# Patient Record
Sex: Male | Born: 1975
Health system: Southern US, Community
[De-identification: ages and names within clinical notes are randomized; demographics above are authoritative.]

## PROBLEM LIST (undated history)

## (undated) DIAGNOSIS — I10 Essential (primary) hypertension: Secondary | ICD-10-CM

## (undated) HISTORY — DX: Essential (primary) hypertension: I10

## (undated) HISTORY — PX: NO PAST SURGERIES: SHX2092

---

## 2015-03-09 ENCOUNTER — Encounter: Payer: Self-pay | Admitting: Family Medicine

## 2015-03-09 ENCOUNTER — Ambulatory Visit (INDEPENDENT_AMBULATORY_CARE_PROVIDER_SITE_OTHER): Payer: BLUE CROSS/BLUE SHIELD | Admitting: Family Medicine

## 2015-03-09 VITALS — BP 150/82 | HR 64 | Temp 98.3°F | Resp 16 | Ht 70.0 in | Wt 190.3 lb

## 2015-03-09 DIAGNOSIS — L723 Sebaceous cyst: Secondary | ICD-10-CM | POA: Diagnosis not present

## 2015-03-09 DIAGNOSIS — I10 Essential (primary) hypertension: Secondary | ICD-10-CM | POA: Diagnosis not present

## 2015-03-09 MED ORDER — HYDROCHLOROTHIAZIDE 12.5 MG PO TABS: 12.5000 mg | ORAL_TABLET | Freq: Every day | ORAL | Status: DC

## 2015-03-09 MED ORDER — HYDROCHLOROTHIAZIDE 12.5 MG PO TABS
12.5000 mg | ORAL_TABLET | Freq: Every day | ORAL | Status: DC
Start: 2015-03-09 — End: 2017-05-09

## 2015-03-09 NOTE — Progress Notes (Signed)
Subjective:    Patient ID: Marcus Proctor, male    DOB: [birth year], 39 y.o.   MRN: [id number]  HPI: Marcus AnaReggie Proctor is a 39 y.o. male presenting on 03/09/2015 for Hypertension   Hypertension Pertinent negatives include no chest pain, headaches or shortness of breath.    Pt presents for BP follow-up.  He is currently out of medication. He was taking 12.5mg  of HCTZ. Has been out of medication for sometime. No CP, SOB, or visual changes. No HA. Limits salt in diet. Exercise- jogging and weight lifting.   Growth on back- present several years. Knows it's there. Doesn't change sizes. Not red. Non painful.   Past Medical History  Diagnosis Date  . Hypertension     No current outpatient prescriptions on file prior to visit.   No current facility-administered medications on file prior to visit.    Review of Systems  Constitutional: Negative for fever and chills.  Respiratory: Negative for chest tightness, shortness of breath and wheezing.   Cardiovascular: Negative for chest pain.  Gastrointestinal: Negative.   Endocrine: Negative for cold intolerance, heat intolerance, polydipsia, polyphagia and polyuria.  Neurological: Negative for light-headedness, numbness and headaches.  Psychiatric/Behavioral: Negative.    Per HPI unless specifically indicated above     Objective:    BP 150/82 mmHg  Pulse 64  Temp(Src) 98.3 F (36.8 C) (Oral)  Resp 16  Ht 5\' 10"  (1.778 m)  Wt 190 lb 4.8 oz (86.32 kg)  BMI 27.31 kg/m2  Wt Readings from Last 3 Encounters:  03/09/15 190 lb 4.8 oz (86.32 kg)    Physical Exam  Constitutional: He is oriented to person, place, and time. He appears well-developed and well-nourished. No distress.  Neck: Normal range of motion. Neck supple. No thyromegaly present.  Cardiovascular: Normal rate and regular rhythm.  Exam reveals no gallop and no friction rub.   No murmur heard. Pulmonary/Chest: Effort normal and breath sounds normal.  Abdominal: Soft. Bowel  sounds are normal. There is no tenderness. There is no rebound.  Musculoskeletal: Normal range of motion. He exhibits no edema or tenderness.  Lymphadenopathy:    He has no cervical adenopathy.  Neurological: He is alert and oriented to person, place, and time.  Skin: Skin is warm and dry. He is not diaphoretic.   No results found for this or any previous visit.    Assessment & Plan:   Problem List Items Addressed This Visit      Cardiovascular and Mediastinum   Essential hypertension - Primary    Renew HCTZ.  Pt encouraged to check BP at home. Reviewed DASH diet. Encouraged continued exercise.  Check BMP. Return to clinic 1 mos for BP check.      Relevant Medications   hydrochlorothiazide (HYDRODIURIL) 12.5 MG tablet   Other Relevant Orders   Basic Metabolic Panel (BMET)    Other Visit Diagnoses    Sebaceous cyst        Cyst on back, not inflamed. Pt would like to monitor.        Meds ordered this encounter  Medications  . DISCONTD: hydrochlorothiazide (HYDRODIURIL) 12.5 MG tablet    Sig: Take 1 tablet (12.5 mg total) by mouth daily.    Dispense:  90 tablet    Refill:  3    Order Specific Question:  Supervising Provider    Answer:  Janeann ForehandHAWKINS JR, JAMES H [phone][[id number]]  . hydrochlorothiazide (HYDRODIURIL) 12.5 MG tablet    Sig: Take 1 tablet (12.5 mg total) by mouth  daily.    Dispense:  90 tablet    Refill:  3    Order Specific Question:  Supervising Provider    Answer:  Janeann Forehand [[id number]]      Follow up plan: Return in about 4 weeks (around 04/06/2015) for BP check.

## 2015-03-09 NOTE — Assessment & Plan Note (Signed)
Renew HCTZ.  Pt encouraged to check BP at home. Reviewed DASH diet. Encouraged continued exercise.  Check BMP. Return to clinic 1 mos for BP check.

## 2015-03-09 NOTE — Patient Instructions (Signed)
Your goal blood pressure is 140/90. Work on low salt/sodium diet - goal <2.5gm (2,500mg) per day. Eat a diet high in fruits/vegetables and whole grains.  Look into mediterranean and DASH diet. Goal activity is 150min/wk of moderate intensity exercise.  This can be split into 30 minute chunks.  If you are not at this level, you can start with smaller 10-15 min increments and slowly build up activity. Look at www.heart.org for more resources  Please seek immediate medical attention at ER or Urgent Care if you develop: Chest pain, pressure or tightness. Shortness of breath accompanied by nausea or diaphoresis Visual changes Numbness or tingling on one side of the body Facial droop Altered mental status Or any concerning symptoms.  

## 2015-03-10 LAB — BASIC METABOLIC PANEL
BUN/Creatinine Ratio: 13 (ref 9–20)
BUN: 14 mg/dL (ref 6–24)
CALCIUM: 9.4 mg/dL (ref 8.7–10.2)
CHLORIDE: 101 mmol/L (ref 97–106)
CO2: 26 mmol/L (ref 18–29)
Creatinine, Ser: 1.1 mg/dL (ref 0.76–1.27)
GFR calc Af Amer: 97 mL/min/{1.73_m2} (ref >59)
GFR, EST NON AFRICAN AMERICAN: 84 mL/min/{1.73_m2} (ref >59)
GLUCOSE: 87 mg/dL (ref 65–99)
POTASSIUM: 4.1 mmol/L (ref 3.5–5.2)
SODIUM: 142 mmol/L (ref 136–144)

## 2015-04-06 ENCOUNTER — Encounter: Payer: Self-pay | Admitting: Family Medicine

## 2015-04-06 ENCOUNTER — Ambulatory Visit (INDEPENDENT_AMBULATORY_CARE_PROVIDER_SITE_OTHER): Payer: BLUE CROSS/BLUE SHIELD | Admitting: Family Medicine

## 2015-04-06 VITALS — BP 128/88 | HR 71 | Temp 98.2°F | Resp 16 | Ht 70.0 in | Wt 190.6 lb

## 2015-04-06 DIAGNOSIS — Z23 Encounter for immunization: Secondary | ICD-10-CM | POA: Diagnosis not present

## 2015-04-06 DIAGNOSIS — I10 Essential (primary) hypertension: Secondary | ICD-10-CM | POA: Diagnosis not present

## 2015-04-06 NOTE — Patient Instructions (Signed)
Your goal blood pressure is 140/90. Work on low salt/sodium diet - goal <2.5gm (2,500mg) per day. Eat a diet high in fruits/vegetables and whole grains.  Look into mediterranean and DASH diet. Goal activity is 150min/wk of moderate intensity exercise.  This can be split into 30 minute chunks.  If you are not at this level, you can start with smaller 10-15 min increments and slowly build up activity. Look at www.heart.org for more resources  Please seek immediate medical attention at ER or Urgent Care if you develop: Chest pain, pressure or tightness. Shortness of breath accompanied by nausea or diaphoresis Visual changes Numbness or tingling on one side of the body Facial droop Altered mental status Or any concerning symptoms.  

## 2015-04-06 NOTE — Assessment & Plan Note (Signed)
BP controlled with restarting his medication. Doing well on medication at home. DASH diet reviewed.  RTC 6 mos.

## 2015-04-06 NOTE — Progress Notes (Signed)
Subjective:    Patient ID: Marcus Proctor, male    DOB: 07-10-1975, 39 y.o.   MRN: [id number]  HPI: Marcus Proctor is a 39 y.o. male presenting on 04/06/2015 for Hypertension   Hypertension This is a chronic problem. The problem has been gradually improving since onset. The problem is controlled. Pertinent negatives include no anxiety, chest pain, headaches, neck pain, palpitations, peripheral edema, shortness of breath or sweats. There are no associated agents to hypertension. Risk factors for coronary artery disease include male gender. Past treatments include diuretics. The current treatment provides significant improvement. There are no compliance problems.     Past Medical History  Diagnosis Date  . Hypertension     Current Outpatient Prescriptions on File Prior to Visit  Medication Sig  . hydrochlorothiazide (HYDRODIURIL) 12.5 MG tablet Take 1 tablet (12.5 mg total) by mouth daily.   No current facility-administered medications on file prior to visit.    Review of Systems  Constitutional: Negative for fever and chills.  HENT: Negative.   Respiratory: Negative for chest tightness, shortness of breath and wheezing.   Cardiovascular: Negative for chest pain, palpitations and leg swelling.  Gastrointestinal: Negative for nausea, vomiting and abdominal pain.  Endocrine: Negative.   Genitourinary: Negative for dysuria, urgency, discharge, penile pain and testicular pain.  Musculoskeletal: Negative for back pain, joint swelling, arthralgias and neck pain.  Skin: Negative.   Neurological: Negative for dizziness, weakness, numbness and headaches.  Psychiatric/Behavioral: Negative for sleep disturbance and dysphoric mood.   Per HPI unless specifically indicated above     Objective:    BP 128/88 mmHg  Pulse 71  Temp(Src) 98.2 F (36.8 C) (Oral)  Resp 16  Ht  (1.778 m)  Wt 190 lb 9.6 oz (86.456 kg)  BMI 27.35 kg/m2  Wt Readings from Last 3 Encounters:  04/06/15 190 lb  9.6 oz (86.456 kg)  03/09/15 190 lb 4.8 oz (86.32 kg)    Physical Exam  Constitutional: He is oriented to person, place, and time. He appears well-developed and well-nourished. No distress.  HENT:  Head: Normocephalic and atraumatic.  Neck: Neck supple. No thyromegaly present.  Cardiovascular: Normal rate, regular rhythm and normal heart sounds.  Exam reveals no gallop and no friction rub.   No murmur heard. Pulmonary/Chest: Effort normal and breath sounds normal. He has no wheezes.  Abdominal: Soft. Bowel sounds are normal. He exhibits no distension. There is no tenderness. There is no rebound.  Musculoskeletal: Normal range of motion. He exhibits no edema or tenderness.  Neurological: He is alert and oriented to person, place, and time. He has normal reflexes.  Skin: Skin is warm and dry. No rash noted. No erythema.  Psychiatric: He has a normal mood and affect. His behavior is normal. Thought content normal.   Results for orders placed or performed in visit on 03/09/15  Basic Metabolic Panel (BMET)  Result Value Ref Range   Glucose 87 65 - 99 mg/dL   BUN 14 6 - 24 mg/dL   Creatinine, Ser 4.09 0.76 - 1.27 mg/dL   GFR calc non Af Amer 84 >59 mL/min/1.73   GFR calc Af Amer 97 >59 mL/min/1.73   BUN/Creatinine Ratio 13 9 - 20   Sodium 142 136 - 144 mmol/L   Potassium 4.1 3.5 - 5.2 mmol/L   Chloride 101 97 - 106 mmol/L   CO2 26 18 - 29 mmol/L   Calcium 9.4 8.7 - 10.2 mg/dL      Assessment & Plan:  Problem List Items Addressed This Visit      Cardiovascular and Mediastinum   Essential hypertension - Primary    BP controlled with restarting his medication. Doing well on medication at home. DASH diet reviewed.  RTC 6 mos.        Other Visit Diagnoses    Need for influenza vaccination        Relevant Orders    Flu Vaccine QUAD 36+ mos PF IM (Fluarix & Fluzone Quad PF) (Completed)       No orders of the defined types were placed in this encounter.      Follow up  plan: Return in about 6 months (around 10/05/2015).

## 2015-10-11 ENCOUNTER — Ambulatory Visit: Payer: BLUE CROSS/BLUE SHIELD | Admitting: Family Medicine

## 2017-05-07 ENCOUNTER — Encounter: Payer: Self-pay | Admitting: Emergency Medicine

## 2017-05-07 ENCOUNTER — Emergency Department
Admission: EM | Admit: 2017-05-07 | Discharge: 2017-05-08 | Disposition: A | Discharge status: Home / Self Care | Payer: BLUE CROSS/BLUE SHIELD | Attending: Emergency Medicine | Admitting: Emergency Medicine

## 2017-05-07 ENCOUNTER — Other Ambulatory Visit: Payer: Self-pay

## 2017-05-07 DIAGNOSIS — R509 Fever, unspecified: Secondary | ICD-10-CM | POA: Diagnosis not present

## 2017-05-07 DIAGNOSIS — I10 Essential (primary) hypertension: Secondary | ICD-10-CM | POA: Insufficient documentation

## 2017-05-07 DIAGNOSIS — R1032 Left lower quadrant pain: Secondary | ICD-10-CM | POA: Insufficient documentation

## 2017-05-07 DIAGNOSIS — K5289 Other specified noninfective gastroenteritis and colitis: Secondary | ICD-10-CM | POA: Insufficient documentation

## 2017-05-07 DIAGNOSIS — K529 Noninfective gastroenteritis and colitis, unspecified: Secondary | ICD-10-CM

## 2017-05-07 DIAGNOSIS — R109 Unspecified abdominal pain: Secondary | ICD-10-CM | POA: Diagnosis not present

## 2017-05-07 DIAGNOSIS — Z79899 Other long term (current) drug therapy: Secondary | ICD-10-CM | POA: Insufficient documentation

## 2017-05-07 DIAGNOSIS — K56699 Other intestinal obstruction unspecified as to partial versus complete obstruction: Secondary | ICD-10-CM | POA: Diagnosis not present

## 2017-05-07 LAB — COMPREHENSIVE METABOLIC PANEL
ALBUMIN: 3.8 g/dL (ref 3.5–5.0)
ALT: 26 U/L (ref 17–63)
AST: 23 U/L (ref 15–41)
Alkaline Phosphatase: 66 U/L (ref 38–126)
Anion gap: 8 (ref 5–15)
BUN: 15 mg/dL (ref 6–20)
CHLORIDE: 100 mmol/L — AB (ref 101–111)
CO2: 29 mmol/L (ref 22–32)
Calcium: 9.3 mg/dL (ref 8.9–10.3)
Creatinine, Ser: 1.06 mg/dL (ref 0.61–1.24)
GFR calc Af Amer: >60 mL/min (ref >60)
Glucose, Bld: 123 mg/dL — ABNORMAL HIGH (ref 65–99)
POTASSIUM: 3.7 mmol/L (ref 3.5–5.1)
SODIUM: 137 mmol/L (ref 135–145)
Total Bilirubin: 0.9 mg/dL (ref 0.3–1.2)
Total Protein: 8.5 g/dL — ABNORMAL HIGH (ref 6.5–8.1)

## 2017-05-07 LAB — CBC
HEMATOCRIT: 41.9 % (ref 40.0–52.0)
Hemoglobin: 13.8 g/dL (ref 13.0–18.0)
MCH: 27.8 pg (ref 26.0–34.0)
MCHC: 32.9 g/dL (ref 32.0–36.0)
MCV: 84.6 fL (ref 80.0–100.0)
Platelets: 255 10*3/uL (ref 150–440)
RBC: 4.95 MIL/uL (ref 4.40–5.90)
RDW: 12.2 % (ref 11.5–14.5)
WBC: 14.4 10*3/uL — AB (ref 3.8–10.6)

## 2017-05-07 LAB — URINALYSIS, COMPLETE (UACMP) WITH MICROSCOPIC
BACTERIA UA: NONE SEEN
BILIRUBIN URINE: NEGATIVE
Glucose, UA: NEGATIVE mg/dL
Hgb urine dipstick: NEGATIVE
KETONES UR: NEGATIVE mg/dL
LEUKOCYTES UA: NEGATIVE
Nitrite: NEGATIVE
Protein, ur: NEGATIVE mg/dL
SPECIFIC GRAVITY, URINE: 1.021 (ref 1.005–1.030)
SQUAMOUS EPITHELIAL / LPF: NONE SEEN
pH: 5 (ref 5.0–8.0)

## 2017-05-07 LAB — LIPASE, BLOOD: Lipase: 17 U/L (ref 11–51)

## 2017-05-07 NOTE — ED Triage Notes (Addendum)
Pt presents [facility] ED with intermittent left lower abd pain since 1/24. Pt took laxative on Saturday evening and pain improved slightly after producing a small bowel movement. Pain returned Sunday. Pt has no hx of the same. Denies nausea or vomiting. Xray performed at urgent care prior to arrival with "distention of large intestine, 1X9.17cm loop of dilated ascending colon" seen. Affected area tender with palpation.

## 2017-05-07 NOTE — ED Notes (Signed)
Pt to the er for air in his abd per urgent care. Pt last BM 5 days ago. Pt states he has not been able to pass gas. Pt reports tenderness to the luq only. No guarding on palpation.

## 2017-05-08 ENCOUNTER — Emergency Department: Payer: BLUE CROSS/BLUE SHIELD

## 2017-05-08 ENCOUNTER — Encounter: Payer: Self-pay | Admitting: Radiology

## 2017-05-08 DIAGNOSIS — R109 Unspecified abdominal pain: Secondary | ICD-10-CM | POA: Diagnosis not present

## 2017-05-08 MED ORDER — ONDANSETRON 4 MG PO TBDP: 4.0000 mg | ORAL_TABLET | Freq: Three times a day (TID) | ORAL | 0 refills | Status: DC | PRN

## 2017-05-08 MED ORDER — MORPHINE SULFATE (PF) 4 MG/ML IV SOLN
4.0000 mg | Freq: Once | INTRAVENOUS | Status: AC
  Administered 2017-05-08: 4 mg via INTRAVENOUS
  Filled 2017-05-08: qty 1

## 2017-05-08 MED ORDER — ACETAMINOPHEN 325 MG PO TABS
650.0000 mg | ORAL_TABLET | Freq: Once | ORAL | Status: AC
  Administered 2017-05-08: 650 mg via ORAL
  Filled 2017-05-08: qty 2

## 2017-05-08 MED ORDER — IOPAMIDOL (ISOVUE-300) INJECTION 61%
100.0000 mL | Freq: Once | INTRAVENOUS | Status: AC | PRN
  Administered 2017-05-08: 100 mL via INTRAVENOUS

## 2017-05-08 MED ORDER — CIPROFLOXACIN IN D5W 400 MG/200ML IV SOLN
400.0000 mg | Freq: Once | INTRAVENOUS | Status: AC
  Administered 2017-05-08: 400 mg via INTRAVENOUS
  Filled 2017-05-08: qty 200

## 2017-05-08 MED ORDER — METRONIDAZOLE 500 MG PO TABS: 500.0000 mg | ORAL_TABLET | Freq: Two times a day (BID) | ORAL | 0 refills | Status: AC

## 2017-05-08 MED ORDER — SODIUM CHLORIDE 0.9 % IV BOLUS (SEPSIS)
1000.0000 mL | Freq: Once | INTRAVENOUS | Status: AC
  Administered 2017-05-08: 1000 mL via INTRAVENOUS

## 2017-05-08 MED ORDER — TRAMADOL HCL 50 MG PO TABS: 50.0000 mg | ORAL_TABLET | Freq: Four times a day (QID) | ORAL | 0 refills | Status: DC | PRN

## 2017-05-08 MED ORDER — CIPROFLOXACIN HCL 500 MG PO TABS: 500.0000 mg | ORAL_TABLET | Freq: Two times a day (BID) | ORAL | 0 refills | Status: AC

## 2017-05-08 MED ORDER — METRONIDAZOLE IN NACL 5-0.79 MG/ML-% IV SOLN
500.0000 mg | Freq: Once | INTRAVENOUS | Status: AC
  Administered 2017-05-08: 500 mg via INTRAVENOUS
  Filled 2017-05-08: qty 100

## 2017-05-08 MED ORDER — ONDANSETRON HCL 4 MG/2ML IJ SOLN
4.0000 mg | Freq: Once | INTRAMUSCULAR | Status: AC
  Administered 2017-05-08: 4 mg via INTRAVENOUS
  Filled 2017-05-08: qty 2

## 2017-05-08 NOTE — ED Notes (Signed)
Pt back from CT

## 2017-05-08 NOTE — ED Notes (Signed)
Patient discharged to home per MD order. Patient in stable condition, and deemed medically cleared by ED provider for discharge. Discharge instructions reviewed with patient/family using "Teach Back"; verbalized understanding of medication education and administration, and information about follow-up care. Denies further concerns. ° °

## 2017-05-08 NOTE — Discharge Instructions (Signed)
Please follow up with he acute care clinic and then GI once you have completed your antibiotic course.

## 2017-05-08 NOTE — ED Notes (Signed)
Pt takes HCTZ for HTN but is noncompliant. Pt educated on the importance of the med.

## 2017-05-08 NOTE — ED Notes (Signed)
Patient transported to CT 

## 2017-05-08 NOTE — ED Provider Notes (Signed)
[facility] - [facility] Emergency Department Provider Note   ____________________________________________   First MD Initiated Contact with Patient 05/07/17 2335     (approximate)  I have reviewed the triage vital signs and the nursing notes.   HISTORY  Chief Complaint Abdominal Pain and Fever    HPI Marcus Proctor is a 42 y.o. male who comes into the hospital today with some lower abdominal pain and fever.  This started approximately 5 days ago.  The patient did not know he had a fever until today.  He denies any vomiting or diarrhea but has had some constipation.  His last normal bowel movement was 6 days ago.  The patient states that the pain is a 7-8 out of 10 in intensity currently.  It is sharp pain and will go away.  The patient tried taking some Dulcolax on Saturday.  The pain improved but then it came back the next day.  He has not really had much of an urge to go to the bathroom.  He is never had any episodes like this before.  The patient went to urgent care and they said that his bowels were distended so he was sent here for further evaluation.   Past Medical History:  Diagnosis Date  . Hypertension     Patient Active Problem List   Diagnosis Date Noted  . Essential hypertension 03/09/2015    History reviewed. No pertinent surgical history.  Prior to Admission medications   Medication Sig Start Date End Date Taking? Authorizing Provider  ciprofloxacin (CIPRO) 500 MG tablet Take 1 tablet (500 mg total) by mouth 2 (two) times daily for 7 days. 05/08/17 05/15/17  Rebecka ApleyWebster, Rafeal Skibicki P, MD  hydrochlorothiazide (HYDRODIURIL) 12.5 MG tablet Take 1 tablet (12.5 mg total) by mouth daily. 03/09/15   Loura PardonKrebs, Amy Lauren, NP  metroNIDAZOLE (FLAGYL) 500 MG tablet Take 1 tablet (500 mg total) by mouth 2 (two) times daily for 7 days. 05/08/17 05/15/17  Rebecka ApleyWebster, Alferd Obryant P, MD  ondansetron (ZOFRAN ODT) 4 MG disintegrating tablet Take 1 tablet (4 mg total) by mouth every 8 (eight)  hours as needed for nausea or vomiting. 05/08/17   Rebecka ApleyWebster, Lilyana Lippman P, MD  traMADol (ULTRAM) 50 MG tablet Take 1 tablet (50 mg total) by mouth every 6 (six) hours as needed. 05/08/17   Rebecka ApleyWebster, Gaetano Romberger P, MD    Allergies Patient has no known allergies.  Family History  Problem Relation Age of Onset  . Hypertension Mother   . Cancer Father        lung cancer  . Hypertension Sister   . Hypertension Brother     Social History Social History   Tobacco Use  . Smoking status: Never Smoker  . Smokeless tobacco: Never Used  Substance Use Topics  . Alcohol use: Yes  . Drug use: No    Review of Systems  Constitutional:  fever Eyes: No visual changes. ENT: No sore throat. Cardiovascular: Denies chest pain. Respiratory: Denies shortness of breath. Gastrointestinal: abdominal pain, constipation. No nausea, no vomiting.  No diarrhea.   Genitourinary: Negative for dysuria. Musculoskeletal: Negative for back pain. Skin: Negative for rash. Neurological: Negative for headaches, focal weakness or numbness.   ____________________________________________   PHYSICAL EXAM:  VITAL SIGNS: ED Triage Vitals  Enc Vitals Group     BP 05/07/17 2029 (!) 175/126     Pulse Rate 05/07/17 2029 (!) 109     Resp 05/07/17 2029 20     Temp 05/07/17 2029 (!) 100.7 F (38.2  C)     Temp Source 05/07/17 2029 Oral     SpO2 05/07/17 2029 97 %     Weight 05/07/17 2031 190 lb (86.2 kg)     Height 05/07/17 2031 5\' 10"  (1.778 m)     Head Circumference --      Peak Flow --      Pain Score --      Pain Loc --      Pain Edu? --      Excl. in GC? --     Constitutional: Alert and oriented. Well appearing and in moderate distress. Eyes: Conjunctivae are normal. PERRL. EOMI. Head: Atraumatic. Nose: No congestion/rhinnorhea. Mouth/Throat: Mucous membranes are moist.  Oropharynx non-erythematous. Cardiovascular: Normal rate, regular rhythm. Grossly normal heart sounds.  Good peripheral  circulation. Respiratory: Normal respiratory effort.  No retractions. Lungs CTAB. Gastrointestinal: Soft with some tenderness to palpation in the left mid quadrant. No distention.  Positive bowel sounds Musculoskeletal: No lower extremity tenderness nor edema.   Neurologic:  Normal speech and language.  Skin:  Skin is warm, dry and intact. Psychiatric: Mood and affect are normal.   ____________________________________________   LABS (all labs ordered are listed, but only abnormal results are displayed)  Labs Reviewed  COMPREHENSIVE METABOLIC PANEL - Abnormal; Notable for the following components:      Result Value   Chloride 100 (*)    Glucose, Bld 123 (*)    Total Protein 8.5 (*)    All other components within normal limits  CBC - Abnormal; Notable for the following components:   WBC 14.4 (*)    All other components within normal limits  URINALYSIS, COMPLETE (UACMP) WITH MICROSCOPIC - Abnormal; Notable for the following components:   Color, Urine YELLOW (*)    APPearance HAZY (*)    All other components within normal limits  LIPASE, BLOOD   ____________________________________________  EKG  none ____________________________________________  RADIOLOGY  CT Abd and Pelvis: Marked thickening of the walls of the proximal descending colon with associated pericolonic inflammation/fluid stranding consistent with acute colitis.  There is some luminal narrowing due to the colonic wall thickening but no associated bowel obstruction at this time.  ____________________________________________   PROCEDURES  Procedure(s) performed: None  Procedures  Critical Care performed: No  ____________________________________________   INITIAL IMPRESSION / ASSESSMENT AND PLAN / ED COURSE  As part of my medical decision making, I reviewed the following data within the electronic MEDICAL RECORD NUMBER Notes from prior ED visits and [place] Controlled Substance Database   This is a 42 year old  male who comes into the hospital today with some abdominal pain and fever.  The patient had an x-ray done at urgent care with showed some bowel distention.  My differential diagnosis includes diverticulitis, colitis, bowel obstruction  The patient's blood work shows a white blood cell count of 14.4 but the remainder is unremarkable.  I will give the patient a liter of normal saline as well as some morphine and Zofran.  He will be reassessed after obtaining a CT scan of his abdomen and pelvis.     Patient CT scan shows some colitis.  I did give the patient some ciprofloxacin and Flagyl.  After the morphine and Zofran his pain was also improved.  Since the patient is not vomiting I feel that he should be able to manage his colitis at home.  He should return with any vomiting any worsening fevers any worsening pain or any other concerns. ____________________________________________   FINAL CLINICAL IMPRESSION(S) /  ED DIAGNOSES  Final diagnoses:  Left lower quadrant pain  Colitis     ED Discharge Orders        Ordered    ciprofloxacin (CIPRO) 500 MG tablet  2 times daily     05/08/17 0319    metroNIDAZOLE (FLAGYL) 500 MG tablet  2 times daily     05/08/17 0319    ondansetron (ZOFRAN ODT) 4 MG disintegrating tablet  Every 8 hours PRN     05/08/17 0319    traMADol (ULTRAM) 50 MG tablet  Every 6 hours PRN     05/08/17 0319       Note:  This document was prepared using Dragon voice recognition software and may include unintentional dictation errors.    Rebecka Apley, MD 05/08/17 [phone]

## 2017-05-09 ENCOUNTER — Other Ambulatory Visit: Payer: Self-pay

## 2017-05-09 ENCOUNTER — Encounter: Payer: Self-pay | Admitting: Nurse Practitioner

## 2017-05-09 ENCOUNTER — Ambulatory Visit (INDEPENDENT_AMBULATORY_CARE_PROVIDER_SITE_OTHER): Payer: BLUE CROSS/BLUE SHIELD | Admitting: Nurse Practitioner

## 2017-05-09 VITALS — BP 149/99 | HR 74 | Temp 98.1°F | Ht 70.0 in | Wt 197.6 lb

## 2017-05-09 DIAGNOSIS — I10 Essential (primary) hypertension: Secondary | ICD-10-CM

## 2017-05-09 DIAGNOSIS — Z23 Encounter for immunization: Secondary | ICD-10-CM | POA: Diagnosis not present

## 2017-05-09 MED ORDER — HYDROCHLOROTHIAZIDE 12.5 MG PO TABS: 12.5000 mg | ORAL_TABLET | Freq: Every day | ORAL | 1 refills | Status: DC

## 2017-05-09 MED ORDER — HYDROCHLOROTHIAZIDE 12.5 MG PO TABS: 12.5000 mg | ORAL_TABLET | Freq: Every day | ORAL | 0 refills | Status: DC

## 2017-05-09 NOTE — Assessment & Plan Note (Signed)
Currently uncontrolled hypertension.  BP above goal of < 130/80.  Pt is working on lifestyle modifications, but has not continued medications since last visit.  He has been off HCTZ for about 8 months.  He was previously taking HCTZ 12.5 mg once daily and was tolerating well without side effects. No current complications.  Kidney function normal, physical exam normal.  Plan: 1. RESUME hydrochlorothiazide 12.5 mg once daily 2. CMP obtained 2 weeks ago at ED and was normal for renal function.  3. Encouraged heart healthy diet and increasing exercise to 30 minutes most days of the week. 4. Check BP 1-2 x per week at home, keep log, and bring to clinic at next appointment. 5. Follow up 6 weeks.  Then if stable, every 6 months.

## 2017-05-09 NOTE — Progress Notes (Signed)
Subjective:    Patient ID: Marcus Proctor, male    DOB: 22-Jun-1975, 42 y.o.   MRN: [id number]  Marcus Proctor is a 42 y.o. male presenting on 05/09/2017 for Hypertension (pt been of bp medication x 7- )   HPI Hypertension - He is not checking BP at home or outside of clinic.   Has BP cuff, but does not use - Current medications: none, but had previously well tolerated HCTZ -He is not currently symptomatic. - Pt denies headache, lightheadedness, dizziness, changes in vision, chest tightness/pressure, palpitations, leg swelling, sudden loss of speech or loss of consciousness. - He  reports no regular exercise routine for last 2 months.  Previously 4 times per week weights (not max weights) and cardio. - His diet is moderate in salt, moderate in fat, and moderate in carbohydrates. - Meal at home for lunch, breakfast oatmeal or eggs and bacon/sausage, cereal.  - Wakes up with headaches occasionally, but is not daily or regular headache associated with high blood pressure.  Social History   Tobacco Use  . Smoking status: Never Smoker  . Smokeless tobacco: Never Used  Substance Use Topics  . Alcohol use: Yes    Comment: ocassional  . Drug use: No    Review of Systems Per HPI unless specifically indicated above     Objective:    BP (!) 149/99 (BP Location: Right Arm, Patient Position: Sitting, Cuff Size: Large)   Pulse 74   Temp 98.1 F (36.7 C) (Oral)   Ht 5\' 10"  (1.778 m)   Wt 197 lb 9.6 oz (89.6 kg)   BMI 28.35 kg/m   Wt Readings from Last 3 Encounters:  05/09/17 197 lb 9.6 oz (89.6 kg)  05/07/17 190 lb (86.2 kg)  04/06/15 190 lb 9.6 oz (86.5 kg)    Physical Exam  General - overweight, well-appearing, NAD HEENT - Normocephalic, atraumatic Neck - supple, non-tender, no LAD, no thyromegaly, no carotid bruit Heart - RRR, no murmurs heard Lungs - Clear throughout all lobes, no wheezing, crackles, or rhonchi. Normal work of breathing. Extremeties - non-tender, no  edema, cap refill < 2 seconds, peripheral pulses intact +2 bilaterally Skin - warm, dry Neuro - awake, alert, oriented x3, normal gait Psych - Normal mood and affect, normal behavior    Results for orders placed or performed during the hospital encounter of 05/07/17  Lipase, blood  Result Value Ref Range   Lipase 17 11 - 51 U/L  Comprehensive metabolic panel  Result Value Ref Range   Sodium 137 135 - 145 mmol/L   Potassium 3.7 3.5 - 5.1 mmol/L   Chloride 100 (L) 101 - 111 mmol/L   CO2 29 22 - 32 mmol/L   Glucose, Bld 123 (H) 65 - 99 mg/dL   BUN 15 6 - 20 mg/dL   Creatinine, Ser 4.09 0.61 - 1.24 mg/dL   Calcium 9.3 8.9 - 81.1 mg/dL   Total Protein 8.5 (H) 6.5 - 8.1 g/dL   Albumin 3.8 3.5 - 5.0 g/dL   AST 23 15 - 41 U/L   ALT 26 17 - 63 U/L   Alkaline Phosphatase 66 38 - 126 U/L   Total Bilirubin 0.9 0.3 - 1.2 mg/dL   GFR calc non Af Amer >60 >60 mL/min   GFR calc Af Amer >60 >60 mL/min   Anion gap 8 5 - 15  CBC  Result Value Ref Range   WBC 14.4 (H) 3.8 - 10.6 K/uL   RBC 4.95 4.40 -  5.90 MIL/uL   Hemoglobin 13.8 13.0 - 18.0 g/dL   HCT 16.141.9 09.640.0 - 04.552.0 %   MCV 84.6 80.0 - 100.0 fL   MCH 27.8 26.0 - 34.0 pg   MCHC 32.9 32.0 - 36.0 g/dL   RDW 40.912.2 81.111.5 - 91.414.5 %   Platelets 255 150 - 440 K/uL  Urinalysis, Complete w Microscopic  Result Value Ref Range   Color, Urine YELLOW (A) YELLOW   APPearance HAZY (A) CLEAR   Specific Gravity, Urine 1.021 1.005 - 1.030   pH 5.0 5.0 - 8.0   Glucose, UA NEGATIVE NEGATIVE mg/dL   Hgb urine dipstick NEGATIVE NEGATIVE   Bilirubin Urine NEGATIVE NEGATIVE   Ketones, ur NEGATIVE NEGATIVE mg/dL   Protein, ur NEGATIVE NEGATIVE mg/dL   Nitrite NEGATIVE NEGATIVE   Leukocytes, UA NEGATIVE NEGATIVE   RBC / HPF 0-5 0 - 5 RBC/hpf   WBC, UA 0-5 0 - 5 WBC/hpf   Bacteria, UA NONE SEEN NONE SEEN   Squamous Epithelial / LPF NONE SEEN NONE SEEN   Mucus PRESENT       Assessment & Plan:   Problem List Items Addressed This Visit       Cardiovascular and Mediastinum   Essential hypertension  -  Primary    Currently uncontrolled hypertension.  BP above goal of < 130/80.  Pt is working on lifestyle modifications, but has not continued medications since last visit.  He has been off HCTZ for about 8 months.  He was previously taking HCTZ 12.5 mg once daily and was tolerating well without side effects. No current complications.  Kidney function normal, physical exam normal.  Plan: 1. RESUME hydrochlorothiazide 12.5 mg once daily 2. CMP obtained 2 weeks ago at ED and was normal for renal function.  3. Encouraged heart healthy diet and increasing exercise to 30 minutes most days of the week. 4. Check BP 1-2 x per week at home, keep log, and bring to clinic at next appointment. 5. Follow up 6 weeks.  Then if stable, every 6 months.        Relevant Medications   hydrochlorothiazide (HYDRODIURIL) 12.5 MG tablet   hydrochlorothiazide (HYDRODIURIL) 12.5 MG tablet (Start on 06/08/2017)    Other Visit Diagnoses    Need for immunization against influenza    Pt < age 43.  Needs annual influenza vaccine.  Plan: 1. Administer quadrivalent flu today.    Relevant Orders   Flu Vaccine QUAD 6+ mos PF IM (Fluarix Quad PF) (Completed)      Meds ordered this encounter  Medications  . hydrochlorothiazide (HYDRODIURIL) 12.5 MG tablet    Sig: Take 1 tablet (12.5 mg total) by mouth daily.    Dispense:  30 tablet    Refill:  0    Order Specific Question:   Supervising Provider    Answer:   Smitty CordsKARAMALEGOS, ALEXANDER J [[id number]]  . hydrochlorothiazide (HYDRODIURIL) 12.5 MG tablet    Sig: Take 1 tablet (12.5 mg total) by mouth daily.    Dispense:  90 tablet    Refill:  1    Order Specific Question:   Supervising Provider    Answer:   Smitty CordsKARAMALEGOS, ALEXANDER J [[id number]]      Follow up plan: Return in about 6 weeks (around 06/20/2017) for hypertension.  [name] Keyasia Jolliff, DNP, AGPCNP-BC Adult Gerontology Primary Care Nurse Practitioner [facility] 05/09/2017, 11:56 AM

## 2017-05-09 NOTE — Patient Instructions (Addendum)
Marcus Proctor, Thank you for coming in to clinic today.  1. Restart your HCTZ 12.5 mg once daily  Please schedule a follow-up appointment with [name] [name], AGNP. Return in about 6 weeks (around 06/20/2017) for hypertension.  If you have any other questions or concerns, please feel free to call the clinic or send a message through MyChart. You may also schedule an earlier appointment if necessary.  You will receive a survey after today's visit either digitally by e-mail or paper by [employer] mail. Your experiences and feedback matter to us.  Please respond so we know how we are doing as we provide care for you.   [name] Analisia Kingsford, DNP, AGNP-BC Adult Gerontology Nurse Practitioner [facility], [facility]

## 2017-06-18 ENCOUNTER — Ambulatory Visit (INDEPENDENT_AMBULATORY_CARE_PROVIDER_SITE_OTHER): Payer: BLUE CROSS/BLUE SHIELD | Admitting: Nurse Practitioner

## 2017-06-18 ENCOUNTER — Other Ambulatory Visit: Payer: Self-pay

## 2017-06-18 ENCOUNTER — Encounter: Payer: Self-pay | Admitting: Nurse Practitioner

## 2017-06-18 VITALS — BP 156/92 | HR 72 | Temp 97.8°F | Ht 70.0 in | Wt 196.4 lb

## 2017-06-18 DIAGNOSIS — I10 Essential (primary) hypertension: Secondary | ICD-10-CM | POA: Diagnosis not present

## 2017-06-18 MED ORDER — HYDROCHLOROTHIAZIDE 12.5 MG PO TABS: 25.0000 mg | ORAL_TABLET | Freq: Every day | ORAL | 1 refills | Status: DC

## 2017-06-18 NOTE — Assessment & Plan Note (Signed)
Continues to have uncontrolled hypertension.  BP above goal of < 130/80.  Pt is working on lifestyle modifications, but has not seen significant improvement on medications since last visit.  He has taken daily HCTZ without missed doses or side effects. No current complications.   Plan: 1. INCREASE hydrochlorothiazide to 25 mg once daily 2. CMP normal at last check.  Recheck at next visit.  3. Encouraged continuing heart healthy diet and increasing exercise to 30 minutes most days (5-6) of the week. 4. Check BP 1-2 x per week at home, keep log, and bring to clinic at next appointment. 5. Follow up 6 weeks.  Then if stable, every 6 months.

## 2017-06-18 NOTE — Patient Instructions (Addendum)
Marcus Proctor, Thank you for coming in to clinic today.  1. Continue healthy lifestyle.  2. INCREASE hydrochlorothiazide to 25 mg once daily.  Take two 12.5 mg tablets.  Please schedule a follow-up appointment with Marcus Proctor, AGNP. Return in about 6 weeks (around 07/30/2017) for hypertension.  If you have any other questions or concerns, please feel free to call the clinic or send a message through MyChart. You may also schedule an earlier appointment if necessary.  You will receive a survey after today's visit either digitally by e-mail or paper by [employer] mail. Your experiences and feedback matter to us.  Please respond so we know how we are doing as we provide care for you.   Marcus McardleLauren Radin Raptis, DNP, AGNP-BC Adult Gerontology Nurse Practitioner [facility], [facility]

## 2017-06-18 NOTE — Progress Notes (Signed)
Subjective:    Patient ID: Marcus Proctor, male    DOB: 1975-06-28, 42 y.o.   MRN: [id number]  Marcus Proctor is a 42 y.o. male presenting on 06/18/2017 for Hypertension   HPI Hypertension - He is not checking BP at home or outside of clinic.    - Current medications: HCTZ 12.5 mg once daily, tolerating well without side effects - He is not currently symptomatic. - Pt denies headache, lightheadedness, dizziness, changes in vision, chest tightness/pressure, palpitations, leg swelling, sudden loss of speech or loss of consciousness. - He  reports an exercise routine that includes gym exercise for about 45 minutes, 4 days per week. - His diet is low in salt, low in fat, and moderate in carbohydrates.    Social History   Tobacco Use  . Smoking status: Never Smoker  . Smokeless tobacco: Never Used  Substance Use Topics  . Alcohol use: Yes    Comment: ocassional  . Drug use: No    Review of Systems Per HPI unless specifically indicated above     Objective:    BP (!) 156/92 (BP Location: Right Arm, Patient Position: Sitting, Cuff Size: Large)   Pulse 72   Temp 97.8 F (36.6 C) (Oral)   Ht 5\' 10"  (1.778 m)   Wt 196 lb 6.4 oz (89.1 kg)   BMI 28.18 kg/m   Wt Readings from Last 3 Encounters:  06/18/17 196 lb 6.4 oz (89.1 kg)  05/09/17 197 lb 9.6 oz (89.6 kg)  05/07/17 190 lb (86.2 kg)    Physical Exam  Constitutional: He is oriented to person, place, and time. He appears well-developed and well-nourished. No distress.  HENT:  Head: Normocephalic and atraumatic.  Neck: Normal range of motion. Neck supple. Carotid bruit is not present.  Cardiovascular: Normal rate, regular rhythm, S1 normal, S2 normal, normal heart sounds and intact distal pulses.  Pulmonary/Chest: Effort normal and breath sounds normal. No respiratory distress.  Musculoskeletal: He exhibits no edema (pedal).  Neurological: He is alert and oriented to person, place, and time.  Skin: Skin is warm and dry.    Psychiatric: He has a normal mood and affect. His behavior is normal.  Vitals reviewed.    Results for orders placed or performed during the hospital encounter of 05/07/17  Lipase, blood  Result Value Ref Range   Lipase 17 11 - 51 U/L  Comprehensive metabolic panel  Result Value Ref Range   Sodium 137 135 - 145 mmol/L   Potassium 3.7 3.5 - 5.1 mmol/L   Chloride 100 (L) 101 - 111 mmol/L   CO2 29 22 - 32 mmol/L   Glucose, Bld 123 (H) 65 - 99 mg/dL   BUN 15 6 - 20 mg/dL   Creatinine, Ser 4.09 0.61 - 1.24 mg/dL   Calcium 9.3 8.9 - 81.1 mg/dL   Total Protein 8.5 (H) 6.5 - 8.1 g/dL   Albumin 3.8 3.5 - 5.0 g/dL   AST 23 15 - 41 U/L   ALT 26 17 - 63 U/L   Alkaline Phosphatase 66 38 - 126 U/L   Total Bilirubin 0.9 0.3 - 1.2 mg/dL   GFR calc non Af Amer >60 >60 mL/min   GFR calc Af Amer >60 >60 mL/min   Anion gap 8 5 - 15  CBC  Result Value Ref Range   WBC 14.4 (H) 3.8 - 10.6 K/uL   RBC 4.95 4.40 - 5.90 MIL/uL   Hemoglobin 13.8 13.0 - 18.0 g/dL   HCT 41.9  40.0 - 52.0 %   MCV 84.6 80.0 - 100.0 fL   MCH 27.8 26.0 - 34.0 pg   MCHC 32.9 32.0 - 36.0 g/dL   RDW 40.912.2 81.111.5 - 91.414.5 %   Platelets 255 150 - 440 K/uL  Urinalysis, Complete w Microscopic  Result Value Ref Range   Color, Urine YELLOW (A) YELLOW   APPearance HAZY (A) CLEAR   Specific Gravity, Urine 1.021 1.005 - 1.030   pH 5.0 5.0 - 8.0   Glucose, UA NEGATIVE NEGATIVE mg/dL   Hgb urine dipstick NEGATIVE NEGATIVE   Bilirubin Urine NEGATIVE NEGATIVE   Ketones, ur NEGATIVE NEGATIVE mg/dL   Protein, ur NEGATIVE NEGATIVE mg/dL   Nitrite NEGATIVE NEGATIVE   Leukocytes, UA NEGATIVE NEGATIVE   RBC / HPF 0-5 0 - 5 RBC/hpf   WBC, UA 0-5 0 - 5 WBC/hpf   Bacteria, UA NONE SEEN NONE SEEN   Squamous Epithelial / LPF NONE SEEN NONE SEEN   Mucus PRESENT       Assessment & Plan:   Problem List Items Addressed This Visit      Cardiovascular and Mediastinum   Essential hypertension - Primary    Continues to have uncontrolled  hypertension.  BP above goal of < 130/80.  Pt is working on lifestyle modifications, but has not seen significant improvement on medications since last visit.  He has taken daily HCTZ without missed doses or side effects. No current complications.   Plan: 1. INCREASE hydrochlorothiazide to 25 mg once daily 2. CMP normal at last check.  Recheck at next visit.  3. Encouraged continuing heart healthy diet and increasing exercise to 30 minutes most days (5-6) of the week. 4. Check BP 1-2 x per week at home, keep log, and bring to clinic at next appointment. 5. Follow up 6 weeks.  Then if stable, every 6 months.        Relevant Medications   hydrochlorothiazide (HYDRODIURIL) 12.5 MG tablet      Meds ordered this encounter  Medications  . hydrochlorothiazide (HYDRODIURIL) 12.5 MG tablet    Sig: Take 2 tablets (25 mg total) by mouth daily.    Dispense:  90 tablet    Refill:  1    Order Specific Question:   Supervising Provider    Answer:   Smitty CordsKARAMALEGOS, ALEXANDER J [[id number]]      Follow up plan: Return in about 6 weeks (around 07/30/2017) for hypertension.   [name] Mohsen Odenthal, DNP, AGPCNP-BC Adult Gerontology Primary Care Nurse Practitioner [facility] 06/18/2017, 8:26 AM

## 2017-06-20 ENCOUNTER — Ambulatory Visit: Payer: Self-pay | Admitting: Nurse Practitioner

## 2017-07-30 ENCOUNTER — Ambulatory Visit: Payer: BLUE CROSS/BLUE SHIELD | Admitting: Nurse Practitioner

## 2017-07-30 ENCOUNTER — Encounter: Payer: Self-pay | Admitting: Family Medicine

## 2017-07-30 ENCOUNTER — Ambulatory Visit (INDEPENDENT_AMBULATORY_CARE_PROVIDER_SITE_OTHER): Payer: BLUE CROSS/BLUE SHIELD | Admitting: Nurse Practitioner

## 2017-07-30 DIAGNOSIS — I10 Essential (primary) hypertension: Secondary | ICD-10-CM | POA: Diagnosis not present

## 2017-07-30 MED ORDER — LISINOPRIL-HYDROCHLOROTHIAZIDE 10-12.5 MG PO TABS: 1.0000 | ORAL_TABLET | Freq: Every day | ORAL | 4 refills | Status: DC

## 2017-07-30 NOTE — Progress Notes (Signed)
Subjective:    Patient ID: Marcus Proctor, male    DOB: January 31, 1976, 42 y.o.   MRN: [id number]  Marcus Proctor is a 42 y.o. male presenting on 07/30/2017 for Hypertension   HPI Hypertension - He is checking BP at home or outside of clinic.  Readings 160/90, 130/85 - Current medications: hydrochlorothiazide 12.5 mg once daily, tolerating well without side effects - He is not currently symptomatic. - Pt denies headache, lightheadedness, dizziness, changes in vision, chest tightness/pressure, palpitations, leg swelling, sudden loss of speech or loss of consciousness. - He  reports an exercise routine that includes cardio and weights (50% max, higher reps), 4-5 days per week. - His diet is moderate in salt, moderate in fat, and moderate in carbohydrates.   Social History   Tobacco Use  . Smoking status: Never Smoker  . Smokeless tobacco: Never Used  Substance Use Topics  . Alcohol use: Yes    Comment: ocassional  . Drug use: No    Review of Systems Per HPI unless specifically indicated above     Objective:    BP 124/76 (BP Location: Right Arm, Patient Position: Supine, Cuff Size: Normal) Comment (Cuff Size): manual  Pulse 74   Temp 98.4 F (36.9 C) (Oral)   Resp 16   Ht 5\' 10"  (1.778 m)   Wt 198 lb (89.8 kg)   BMI 28.41 kg/m   Wt Readings from Last 3 Encounters:  07/30/17 198 lb (89.8 kg)  06/18/17 196 lb 6.4 oz (89.1 kg)  05/09/17 197 lb 9.6 oz (89.6 kg)    Physical Exam  Constitutional: He is oriented to person, place, and time. He appears well-developed and well-nourished. No distress.  Neck: Normal range of motion. Neck supple.  Cardiovascular: Normal rate, regular rhythm, S1 normal, S2 normal, normal heart sounds and intact distal pulses.  Pulmonary/Chest: Effort normal and breath sounds normal. No respiratory distress.  Musculoskeletal: He exhibits no edema (pedal).  Neurological: He is alert and oriented to person, place, and time.  Skin: Skin is warm and dry.    Psychiatric: He has a normal mood and affect. His behavior is normal.  Vitals reviewed.   Results for orders placed or performed during the hospital encounter of 05/07/17  Lipase, blood  Result Value Ref Range   Lipase 17 11 - 51 U/L  Comprehensive metabolic panel  Result Value Ref Range   Sodium 137 135 - 145 mmol/L   Potassium 3.7 3.5 - 5.1 mmol/L   Chloride 100 (L) 101 - 111 mmol/L   CO2 29 22 - 32 mmol/L   Glucose, Bld 123 (H) 65 - 99 mg/dL   BUN 15 6 - 20 mg/dL   Creatinine, Ser 4.091.06 0.61 - 1.24 mg/dL   Calcium 9.3 8.9 - 81.110.3 mg/dL   Total Protein 8.5 (H) 6.5 - 8.1 g/dL   Albumin 3.8 3.5 - 5.0 g/dL   AST 23 15 - 41 U/L   ALT 26 17 - 63 U/L   Alkaline Phosphatase 66 38 - 126 U/L   Total Bilirubin 0.9 0.3 - 1.2 mg/dL   GFR calc non Af Amer >60 >60 mL/min   GFR calc Af Amer >60 >60 mL/min   Anion gap 8 5 - 15  CBC  Result Value Ref Range   WBC 14.4 (H) 3.8 - 10.6 K/uL   RBC 4.95 4.40 - 5.90 MIL/uL   Hemoglobin 13.8 13.0 - 18.0 g/dL   HCT 91.441.9 78.240.0 - 95.652.0 %   MCV 84.6 80.0 -  100.0 fL   MCH 27.8 26.0 - 34.0 pg   MCHC 32.9 32.0 - 36.0 g/dL   RDW 40.9 81.1 - 91.4 %   Platelets 255 150 - 440 K/uL  Urinalysis, Complete w Microscopic  Result Value Ref Range   Color, Urine YELLOW (A) YELLOW   APPearance HAZY (A) CLEAR   Specific Gravity, Urine 1.021 1.005 - 1.030   pH 5.0 5.0 - 8.0   Glucose, UA NEGATIVE NEGATIVE mg/dL   Hgb urine dipstick NEGATIVE NEGATIVE   Bilirubin Urine NEGATIVE NEGATIVE   Ketones, ur NEGATIVE NEGATIVE mg/dL   Protein, ur NEGATIVE NEGATIVE mg/dL   Nitrite NEGATIVE NEGATIVE   Leukocytes, UA NEGATIVE NEGATIVE   RBC / HPF 0-5 0 - 5 RBC/hpf   WBC, UA 0-5 0 - 5 WBC/hpf   Bacteria, UA NONE SEEN NONE SEEN   Squamous Epithelial / LPF NONE SEEN NONE SEEN   Mucus PRESENT       Assessment & Plan:   Problem List Items Addressed This Visit      Cardiovascular and Mediastinum   Essential hypertension    Continues to have uncontrolled hypertension.   BP above goal of < 130/80.  Pt is working on lifestyle modifications, but has not seen significant improvement on medications since last visit.  He has taken daily HCTZ without missed doses or side effects. No current complications.  Home BP readings regularly 130-160/80-90 and vary from one arm to the other per pt report.  Recheck in clinic is not suggestive of subclavian stenosis as BP readings were similar and consistent.  Plan: 1. START lisinopril - HCTZ 10-12.5 mg once daily.   2. CMP normal at last check.  Recheck in 3 weeks after starting lisinopril.  3. Encouraged continuing heart healthy diet and increasing exercise to 30 minutes most days (5-6) of the week. 4. Check BP 1-2 x per week at home, keep log, and bring to clinic at next appointment. 5. Follow up 6 weeks.  Then if stable, every 6 months.        Relevant Medications   lisinopril-hydrochlorothiazide (PRINZIDE,ZESTORETIC) 10-12.5 MG tablet   Other Relevant Orders   BASIC METABOLIC PANEL WITH GFR      Meds ordered this encounter  Medications  . lisinopril-hydrochlorothiazide (PRINZIDE,ZESTORETIC) 10-12.5 MG tablet    Sig: Take 1 tablet by mouth daily.    Dispense:  30 tablet    Refill:  4    Order Specific Question:   Supervising Provider    Answer:   Smitty Cords [[id number]]    Follow up plan: Return in about 6 weeks (around 09/10/2017) for Hypertension AND in 2-3 weeks for labs and BP check with CMA,.  Wilhelmina Mcardle, DNP, AGPCNP-BC Adult Gerontology Primary Care Nurse Practitioner [facility] 07/30/2017, 8:44 AM

## 2017-07-30 NOTE — Assessment & Plan Note (Signed)
Continues to have uncontrolled hypertension.  BP above goal of < 130/80.  Pt is working on lifestyle modifications, but has not seen significant improvement on medications since last visit.  He has taken daily HCTZ without missed doses or side effects. No current complications.  Home BP readings regularly 130-160/80-90 and vary from one arm to the other per pt report.  Recheck in clinic is not suggestive of subclavian stenosis as BP readings were similar and consistent.  Plan: 1. START lisinopril - HCTZ 10-12.5 mg once daily.   2. CMP normal at last check.  Recheck in 3 weeks after starting lisinopril.  3. Encouraged continuing heart healthy diet and increasing exercise to 30 minutes most days (5-6) of the week. 4. Check BP 1-2 x per week at home, keep log, and bring to clinic at next appointment. 5. Follow up 6 weeks.  Then if stable, every 6 months.

## 2017-07-30 NOTE — Patient Instructions (Addendum)
Marcus Proctor,   Thank you for coming in to clinic today.  1. START lisinopril - hydrochlorothiazide 10-12.5 mg once daily   Some of the possible side effects are:  - angioedema: swelling of lips, mouth, and tongue.  If this rare side effect occurs, please go to ED. - cough: you could develop a dry, hacking cough caused by this medicine.  If it occurs, it will go away after stopping this medicine.  Call the clinic before stopping the medication. - kidney damage: we will monitor your labs when we start this medicine and at least one time per year.  If you do not have an change in kidney function when starting this medicine, it will provide kidney protection over time.   Please schedule a follow-up appointment with Marcus Proctor, AGNP. Return in about 6 weeks (around 09/10/2017) for Hypertension AND in 2-3 weeks for labs and BP check with CMA,.  If you have any other questions or concerns, please feel free to call the clinic or send a message through MyChart. You may also schedule an earlier appointment if necessary.  You will receive a survey after today's visit either digitally by e-mail or paper by [employer] mail. Your experiences and feedback matter to us.  Please respond so we know how we are doing as we provide care for you.   Marcus McardleLauren Latondra Gebhart, DNP, AGNP-BC Adult Gerontology Nurse Practitioner [facility], [facility]

## 2017-08-24 ENCOUNTER — Telehealth: Payer: Self-pay

## 2017-08-24 ENCOUNTER — Ambulatory Visit: Payer: BLUE CROSS/BLUE SHIELD

## 2017-08-24 VITALS — BP 128/70 | Ht 70.0 in

## 2017-08-24 DIAGNOSIS — I10 Essential (primary) hypertension: Secondary | ICD-10-CM

## 2017-08-24 NOTE — Telephone Encounter (Signed)
We can move it to 3 months (10/29/2017) if needed.  Then, after that visit we can move to 6 month followup.

## 2017-08-24 NOTE — Telephone Encounter (Signed)
Pt came in the office today for a bp check. Blood pressure today in the office is 128/70. He reports his blood pressure last week at home was 134/80. He wanted to know if he could schedule to f/u in 6 mths vs 6 weeks since bp is normal today. Please advise

## 2017-08-24 NOTE — Progress Notes (Signed)
The pt reports he checked his blood pressure last week an it was 134/80. Today blood pressure is 128/70.

## 2017-08-24 NOTE — Telephone Encounter (Signed)
The pt was notified. He will call back and schedule an appt.

## 2017-11-04 ENCOUNTER — Other Ambulatory Visit: Payer: Self-pay | Admitting: Nurse Practitioner

## 2017-11-04 DIAGNOSIS — I10 Essential (primary) hypertension: Secondary | ICD-10-CM

## 2018-01-03 ENCOUNTER — Other Ambulatory Visit: Payer: Self-pay | Admitting: Nurse Practitioner

## 2018-01-03 DIAGNOSIS — I10 Essential (primary) hypertension: Secondary | ICD-10-CM

## 2018-03-25 ENCOUNTER — Other Ambulatory Visit: Payer: Self-pay | Admitting: Nurse Practitioner

## 2018-03-25 DIAGNOSIS — I10 Essential (primary) hypertension: Secondary | ICD-10-CM

## 2018-04-10 ENCOUNTER — Other Ambulatory Visit: Payer: Self-pay | Admitting: Nurse Practitioner

## 2018-04-10 DIAGNOSIS — I10 Essential (primary) hypertension: Secondary | ICD-10-CM

## 2018-06-05 ENCOUNTER — Other Ambulatory Visit: Payer: Self-pay | Admitting: Nurse Practitioner

## 2018-06-05 DIAGNOSIS — I10 Essential (primary) hypertension: Secondary | ICD-10-CM

## 2018-07-10 ENCOUNTER — Telehealth: Payer: Self-pay

## 2018-07-10 ENCOUNTER — Other Ambulatory Visit: Payer: Self-pay | Admitting: Nurse Practitioner

## 2018-07-10 DIAGNOSIS — I10 Essential (primary) hypertension: Secondary | ICD-10-CM

## 2018-07-10 NOTE — Telephone Encounter (Signed)
Attempted to contact the pt to notify him that he needs an office visit prior to refills.LMOM. I was going to give him the option of a virtual visit this afternoon per Dr. Kirtland Bouchard.

## 2018-08-29 ENCOUNTER — Other Ambulatory Visit: Payer: Self-pay | Admitting: Nurse Practitioner

## 2018-08-29 DIAGNOSIS — I10 Essential (primary) hypertension: Secondary | ICD-10-CM

## 2018-08-30 IMAGING — CT CT ABD-PELV W/ CM
2 of 5 series · 15 of 46 positions shown, 17 images · IV contrast (APPLIED)
Comparison: None.

CLINICAL DATA: Intermittent left lower abdominal pain since [REDACTED].

EXAM:
CT ABDOMEN AND PELVIS WITH CONTRAST
TECHNIQUE: Multidetector CT imaging of the abdomen and pelvis was performed
using the standard protocol following bolus administration of
intravenous contrast.
CONTRAST:  100mL 77ZMF5-IOO IOPAMIDOL (77ZMF5-IOO) INJECTION 61%

[Series 2: routine abd/pel with · axial · 0.68mm/px · z∈[-621,-231]mm · 12 of 88 slices shown, 14 images]
[im 5/88  soft-tissue]
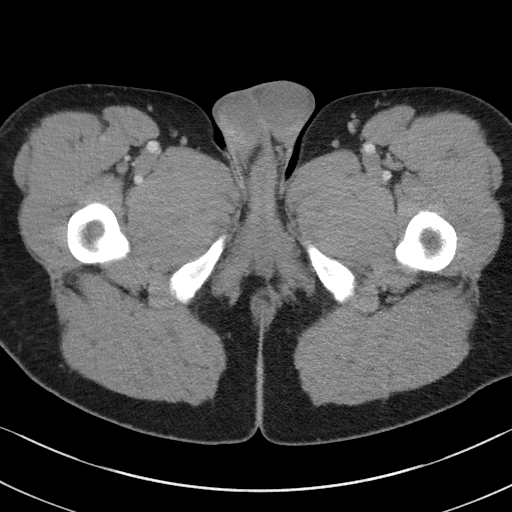
[im 5/88  bone]
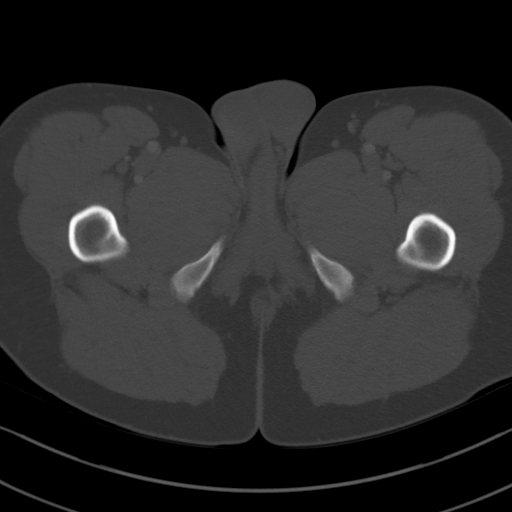
[im 14/88  soft-tissue]
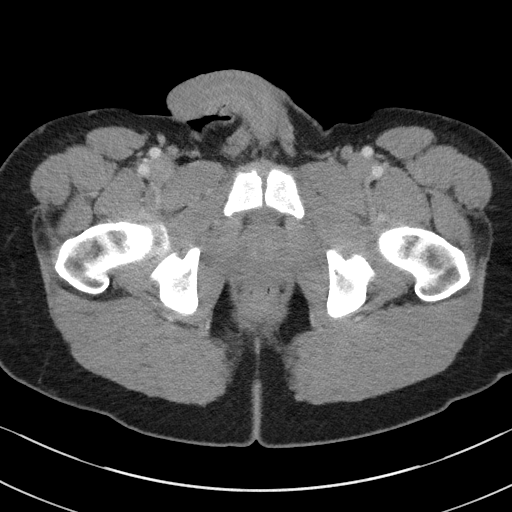
[im 19/88  soft-tissue]
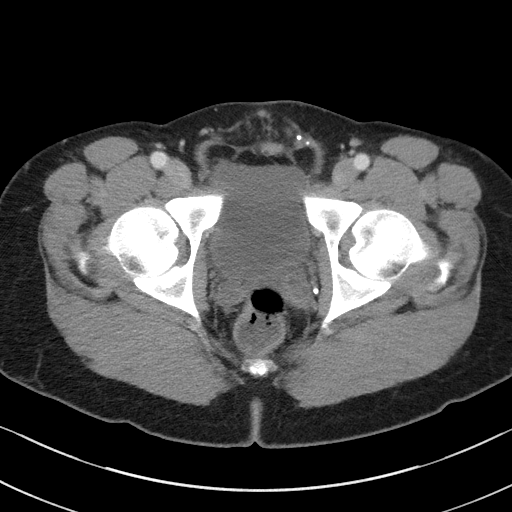
[im 28/88  soft-tissue]
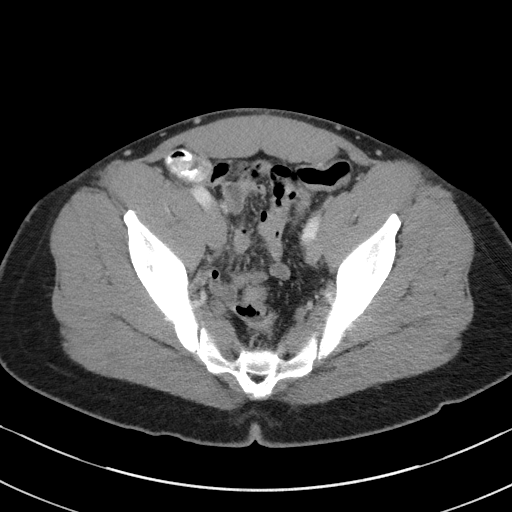
[im 33/88  soft-tissue]
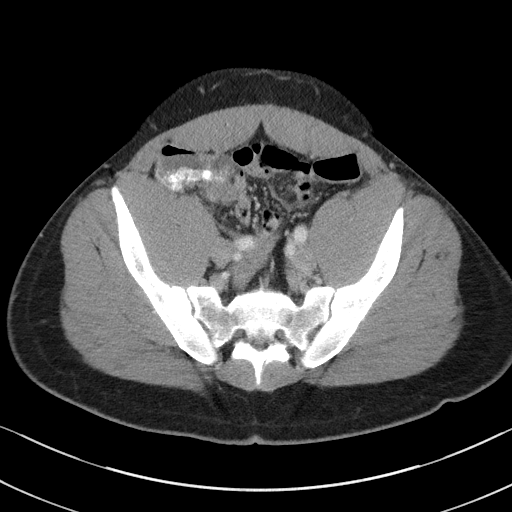
[im 42/88  soft-tissue]
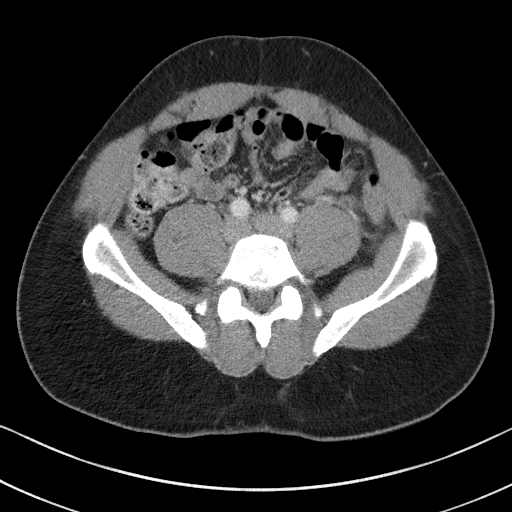
[im 46/88  soft-tissue]
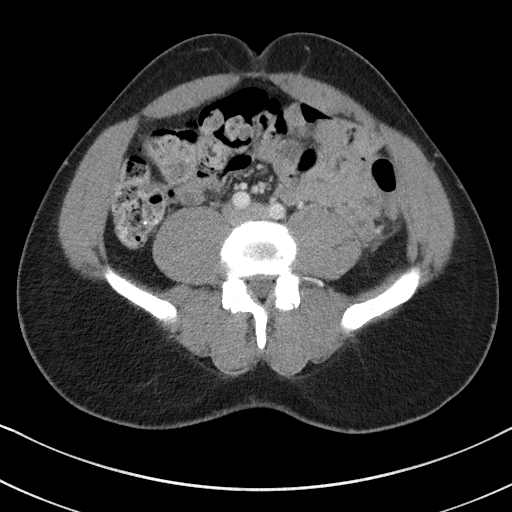
[im 55/88  soft-tissue]
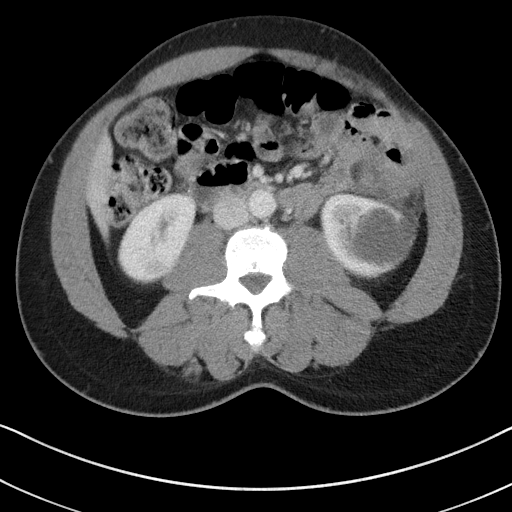
[im 60/88  soft-tissue]
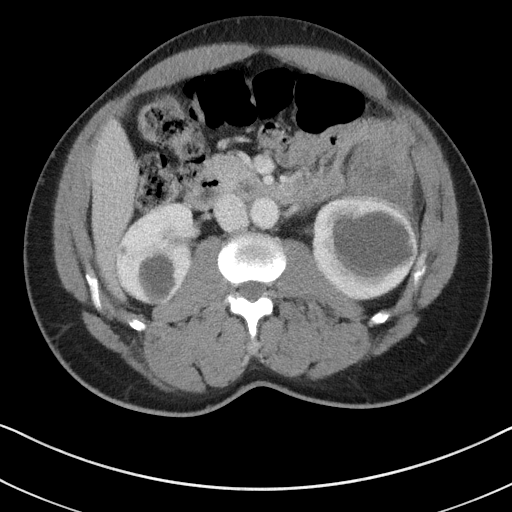
[im 60/88  bone]
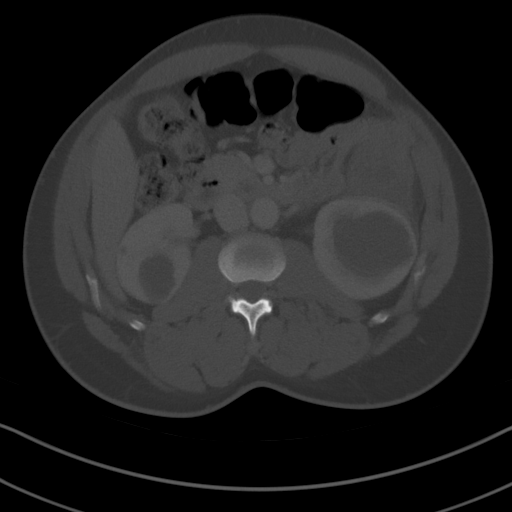
[im 69/88  soft-tissue]
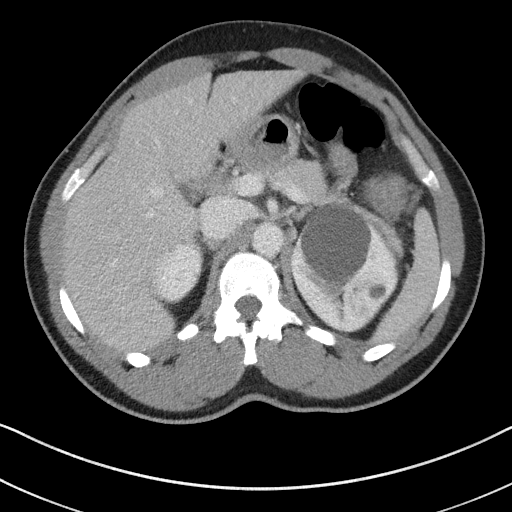
[im 74/88  soft-tissue]
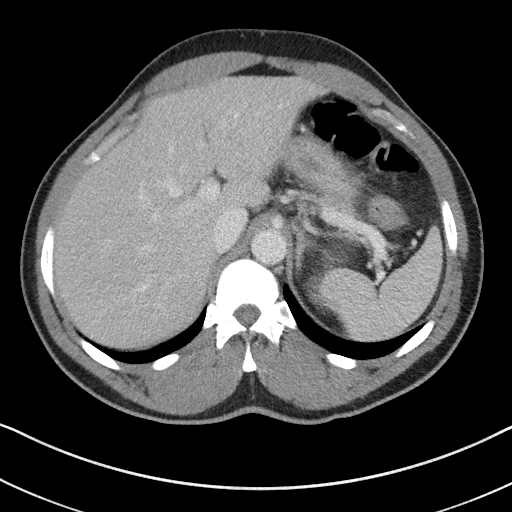
[im 83/88  soft-tissue]
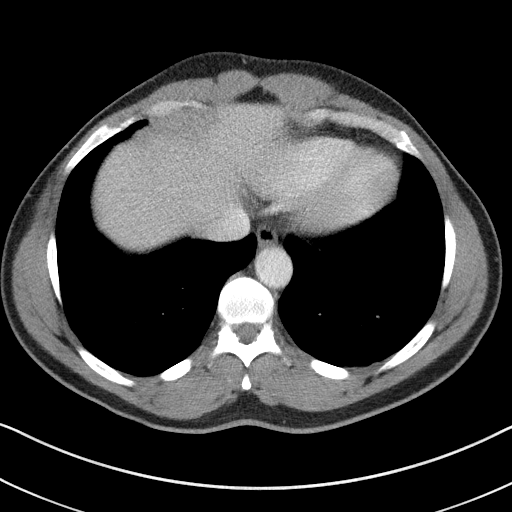

[Series 6: coronal st · coronal · 0.64mm/px · 3 of 89 slices shown]
[im 30/89  soft-tissue]
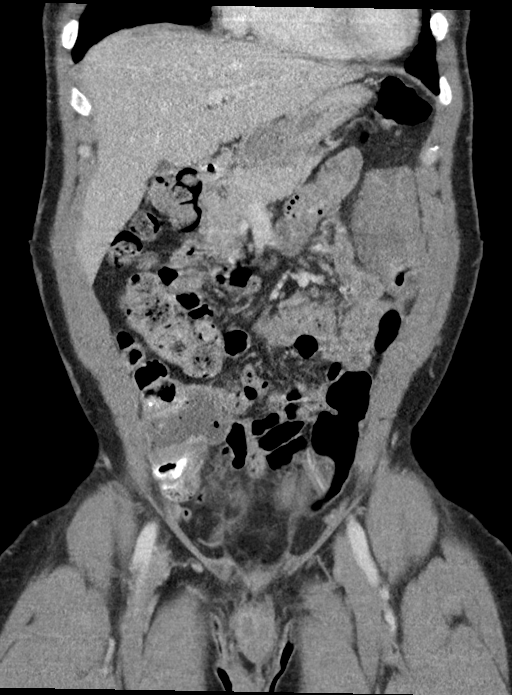
[im 40/89  soft-tissue]
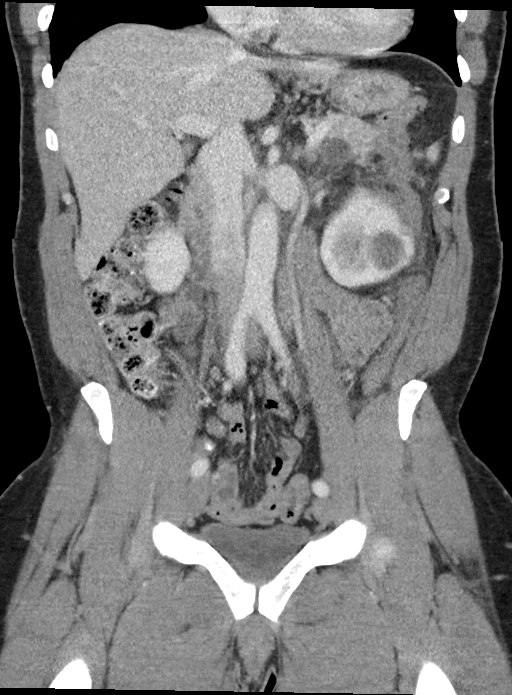
[im 49/89  soft-tissue]
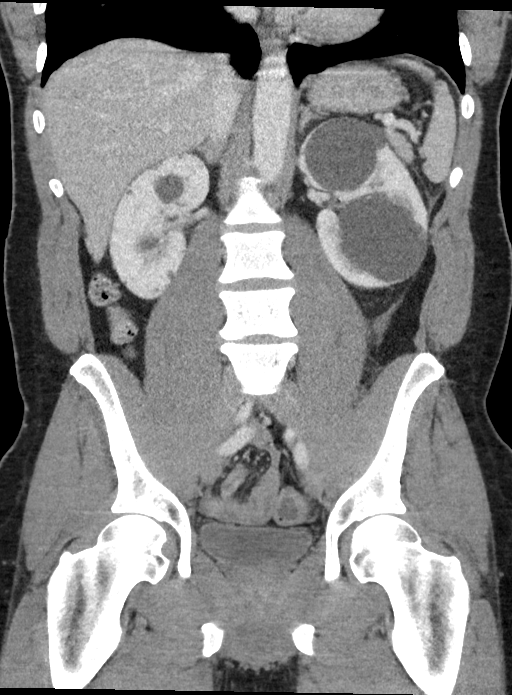

[15 of 46 positions shown; findings below may reference images not displayed]

FINDINGS: Lower chest: No acute abnormality.

Hepatobiliary: No focal liver abnormality is seen. No gallstones,
gallbladder wall thickening, or biliary dilatation.

Pancreas: Unremarkable. No pancreatic ductal dilatation or
surrounding inflammatory changes.

Spleen: Normal in size without focal abnormality.

Adrenals/Urinary Tract: Bilateral renal cysts. No renal stone or
hydronephrosis. Bladder appears normal.

Stomach/Bowel: Marked thickening of the walls of the proximal
descending colon. Associated pericolonic inflammation and associated
fluid stranding within the left paracolic gutter. No circumscribed
abscess collection seen.

11 mm hyperdense focus within the thickened wall of the proximal
descending colon, suspicious for acute hemorrhage, possibly retained
oral contrast from previous barium exam, favor the latter.

No evidence of bowel obstruction. Stomach is unremarkable. Appendix
is normal, containing oral contrast material.

Vascular/Lymphatic: No significant vascular findings are present. No
enlarged abdominal or pelvic lymph nodes.

Reproductive: Prostate is unremarkable.

Other: No abscess collection seen.  No free intraperitoneal air.

Musculoskeletal: No acute or suspicious osseous finding.
IMPRESSION: 1. Marked thickening of the walls of the proximal descending colon,
involving a 10 cm segment of the colon, with associated pericolonic
inflammation/fluid stranding, consistent with acute colitis of
infectious, inflammatory or ischemic etiology. Favor infectious
colitis. There is marked luminal narrowing due to the colonic wall
thickening but no associated bowel obstruction at this time.
Recommend follow-up CT in 1-2 months to ensure resolution, to
exclude the less likely possibility of neoplastic thickening.
2. 11 mm hyperdense focus within the thickened wall of the proximal
descending colon, differential considerations including acute
hemorrhage and retained oral contrast from previous fluoroscopic or
CT exam. Favor retained oral contrast given the stability in
appearance on both early contrast and delayed contrast images.
3. No abscess collection or free intraperitoneal air seen.
4. Bilateral renal cysts.

## 2018-09-12 ENCOUNTER — Other Ambulatory Visit: Payer: Self-pay

## 2018-09-12 ENCOUNTER — Ambulatory Visit (INDEPENDENT_AMBULATORY_CARE_PROVIDER_SITE_OTHER): Payer: BC Managed Care – PPO | Admitting: Family Medicine

## 2018-09-12 ENCOUNTER — Encounter: Payer: Self-pay | Admitting: Family Medicine

## 2018-09-12 DIAGNOSIS — I10 Essential (primary) hypertension: Secondary | ICD-10-CM

## 2018-09-12 MED ORDER — LISINOPRIL-HYDROCHLOROTHIAZIDE 10-12.5 MG PO TABS: 1.0000 | ORAL_TABLET | Freq: Every day | ORAL | 1 refills | Status: DC

## 2018-09-12 NOTE — Progress Notes (Signed)
Subjective:    Patient ID: Marcus Proctor, male    DOB: [birth year], 43 y.o.   MRN: [id number]  Marcus Proctor is a 43 y.o. male presenting on 09/12/2018 for Hypertension  PCP is [name] Kennedy, AGPCNP-BC - I am currently covering during her maternity leave.   HPI   CHRONIC HTN: Reports out of lisinopril-hctz for 1 month, he had leftover hctz 12.5mg  daily, he took that instead. He has BP cuff at home, but not using it regularly. Current Meds - hctz 12.5mg  daily   Reports good compliance, took meds today. Tolerating well, w/o complaints. Lifestyle: - Diet: limited salt in diet, tries to eat healthy - Exercise: regular exercise, now not able to go to gym as much Denies CP, dyspnea, HA, edema, dizziness / lightheadedness    [facility] 07/30/2017 05/09/2017 04/06/2015  Decreased Interest 0 0 0  Down, Depressed, Hopeless 0 0 0  PHQ - 2 Score 0 0 0    Social History   Tobacco Use  . Smoking status: Never Smoker  . Smokeless tobacco: Never Used  Substance Use Topics  . Alcohol use: Yes    Comment: ocassional  . Drug use: No    Review of Systems Per HPI unless specifically indicated above     Objective:    BP 127/88 (BP Location: Left Arm, Patient Position: Sitting, Cuff Size: Normal)   Pulse 65   Temp 98.1 F (36.7 C) (Oral)   Ht 5\' 10"  (1.778 m)   Wt 186 lb 12.8 oz (84.7 kg)   BMI 26.80 kg/m   Wt Readings from Last 3 Encounters:  09/12/18 186 lb 12.8 oz (84.7 kg)  07/30/17 198 lb (89.8 kg)  06/18/17 196 lb 6.4 oz (89.1 kg)    Physical Exam Vitals signs and nursing note reviewed.  Constitutional:      General: He is not in acute distress.    Appearance: He is well-developed. He is not diaphoretic.     Comments: Well-appearing, comfortable, cooperative  HENT:     Head: Normocephalic and atraumatic.  Eyes:     General:        Right eye: No discharge.        Left eye: No discharge.     Conjunctiva/sclera: Conjunctivae normal.  Cardiovascular:   Rate and Rhythm: Normal rate.  Pulmonary:     Effort: Pulmonary effort is normal.  Skin:    General: Skin is warm and dry.     Findings: No erythema or rash.  Neurological:     Mental Status: He is alert and oriented to person, place, and time.  Psychiatric:        Behavior: Behavior normal.     Comments: Well groomed, good eye contact, normal speech and thoughts        Results for orders placed or performed during the hospital encounter of 05/07/17  Lipase, blood  Result Value Ref Range   Lipase 17 11 - 51 U/L  Comprehensive metabolic panel  Result Value Ref Range   Sodium 137 135 - 145 mmol/L   Potassium 3.7 3.5 - 5.1 mmol/L   Chloride 100 (L) 101 - 111 mmol/L   CO2 29 22 - 32 mmol/L   Glucose, Bld 123 (H) 65 - 99 mg/dL   BUN 15 6 - 20 mg/dL   Creatinine, Ser 4.401.06 0.61 - 1.24 mg/dL   Calcium 9.3 8.9 - 34.710.3 mg/dL   Total Protein 8.5 (H) 6.5 - 8.1 g/dL   Albumin 3.8  3.5 - 5.0 g/dL   AST 23 15 - 41 U/L   ALT 26 17 - 63 U/L   Alkaline Phosphatase 66 38 - 126 U/L   Total Bilirubin 0.9 0.3 - 1.2 mg/dL   GFR calc non Af Amer >60 >60 mL/min   GFR calc Af Amer >60 >60 mL/min   Anion gap 8 5 - 15  CBC  Result Value Ref Range   WBC 14.4 (H) 3.8 - 10.6 K/uL   RBC 4.95 4.40 - 5.90 MIL/uL   Hemoglobin 13.8 13.0 - 18.0 g/dL   HCT 34.1 96.2 - 22.9 %   MCV 84.6 80.0 - 100.0 fL   MCH 27.8 26.0 - 34.0 pg   MCHC 32.9 32.0 - 36.0 g/dL   RDW 79.8 92.1 - 19.4 %   Platelets 255 150 - 440 K/uL  Urinalysis, Complete w Microscopic  Result Value Ref Range   Color, Urine YELLOW (A) YELLOW   APPearance HAZY (A) CLEAR   Specific Gravity, Urine 1.021 1.005 - 1.030   pH 5.0 5.0 - 8.0   Glucose, UA NEGATIVE NEGATIVE mg/dL   Hgb urine dipstick NEGATIVE NEGATIVE   Bilirubin Urine NEGATIVE NEGATIVE   Ketones, ur NEGATIVE NEGATIVE mg/dL   Protein, ur NEGATIVE NEGATIVE mg/dL   Nitrite NEGATIVE NEGATIVE   Leukocytes, UA NEGATIVE NEGATIVE   RBC / HPF 0-5 0 - 5 RBC/hpf   WBC, UA 0-5 0 - 5  WBC/hpf   Bacteria, UA NONE SEEN NONE SEEN   Squamous Epithelial / LPF NONE SEEN NONE SEEN   Mucus PRESENT       Assessment & Plan:   Problem List Items Addressed This Visit    Essential hypertension   Relevant Medications   lisinopril-hydrochlorothiazide (ZESTORETIC) 10-12.5 MG tablet      Mostly controlled BP even on low dose med, but still elevated DBP, < 90 but still higher than preferred, as it is >85 - Home BP readings none available today  No known complications    Plan:  1. RESTART Lisinopril-HCTZ 10-12.5mg  daily - new rx 90 day with 1 refill sent today - Stop the hctz monotherapy, seems this may have been effective, but will return to previous plan given lack of detailed BP monitor readings since last visit 2. Encourage improved lifestyle - low sodium diet, regular exercise 3. Start monitor BP outside office, bring readings to next visit, if persistently >140/90 or new symptoms notify office sooner 4. Follow-up 4 months   Meds ordered this encounter  Medications  . lisinopril-hydrochlorothiazide (ZESTORETIC) 10-12.5 MG tablet    Sig: Take 1 tablet by mouth daily.    Dispense:  90 tablet    Refill:  1    Follow up plan: Return in about 4 months (around 01/12/2019) for Annual Physical.   Saralyn Pilar, DO [facility] 09/12/2018, 8:35 AM

## 2018-09-12 NOTE — Patient Instructions (Addendum)
Thank you for coming to the office today.  Switch BP med back to Lisinopril-HCTZ 10-12.5mg  daily - new rx sent, 90 days  Keep track of BP and write down readings in app, bring to next visit  DUE for FASTING BLOOD WORK (no food or drink after midnight before the lab appointment, only water or coffee without cream/sugar on the morning of)  SCHEDULE "Lab Only" visit in the morning at the clinic for lab draw in 4 MONTHS   - Make sure Lab Only appointment is at about 1 week before your next appointment, so that results will be available  For Lab Results, once available within 2-3 days of blood draw, you can can log in to MyChart online to view your results and a brief explanation. Also, we can discuss results at next follow-up visit.   Please schedule a Follow-up Appointment to: Return in about 4 months (around 01/12/2019) for Annual Physical.  If you have any other questions or concerns, please feel free to call the office or send a message through MyChart. You may also schedule an earlier appointment if necessary.  Additionally, you may be receiving a survey about your experience at our office within a few days to 1 week by e-mail or mail. We value your feedback.  Saralyn Pilar, DO [facility], [place]

## 2018-11-22 ENCOUNTER — Other Ambulatory Visit: Payer: Self-pay | Admitting: Family Medicine

## 2018-11-22 DIAGNOSIS — I10 Essential (primary) hypertension: Secondary | ICD-10-CM

## 2018-11-24 MED ORDER — LISINOPRIL-HYDROCHLOROTHIAZIDE 10-12.5 MG PO TABS: 1.0000 | ORAL_TABLET | Freq: Every day | ORAL | 1 refills | Status: DC

## 2018-11-27 ENCOUNTER — Other Ambulatory Visit: Payer: Self-pay

## 2019-01-06 ENCOUNTER — Other Ambulatory Visit: Payer: Self-pay

## 2019-01-06 ENCOUNTER — Other Ambulatory Visit: Payer: Self-pay | Admitting: Family Medicine

## 2019-01-06 ENCOUNTER — Other Ambulatory Visit: Payer: BC Managed Care – PPO

## 2019-01-06 ENCOUNTER — Encounter: Payer: Self-pay | Admitting: Nurse Practitioner

## 2019-01-06 DIAGNOSIS — Z0000 Encounter for general adult medical examination without abnormal findings: Secondary | ICD-10-CM

## 2019-01-06 DIAGNOSIS — R7309 Other abnormal glucose: Secondary | ICD-10-CM | POA: Diagnosis not present

## 2019-01-07 LAB — COMPREHENSIVE METABOLIC PANEL
AG Ratio: 1.5 (calc) (ref 1.0–2.5)
ALT: 17 U/L (ref 9–46)
AST: 21 U/L (ref 10–40)
Albumin: 4.3 g/dL (ref 3.6–5.1)
Alkaline phosphatase (APISO): 53 U/L (ref 36–130)
BUN: 20 mg/dL (ref 7–25)
CO2: 28 mmol/L (ref 20–32)
Calcium: 9.4 mg/dL (ref 8.6–10.3)
Chloride: 104 mmol/L (ref 98–110)
Creat: 1.14 mg/dL (ref 0.60–1.35)
Globulin: 2.9 g/dL (calc) (ref 1.9–3.7)
Glucose, Bld: 101 mg/dL (ref 65–139)
Potassium: 3.9 mmol/L (ref 3.5–5.3)
Sodium: 141 mmol/L (ref 135–146)
Total Bilirubin: 0.5 mg/dL (ref 0.2–1.2)
Total Protein: 7.2 g/dL (ref 6.1–8.1)

## 2019-01-07 LAB — CBC WITH DIFFERENTIAL/PLATELET
Absolute Monocytes: 551 cells/uL (ref 200–950)
Basophils Absolute: 38 cells/uL (ref 0–200)
Basophils Relative: 0.7 %
Eosinophils Absolute: 108 cells/uL (ref 15–500)
Eosinophils Relative: 2 %
HCT: 40.6 % (ref 38.5–50.0)
Hemoglobin: 13 g/dL — ABNORMAL LOW (ref 13.2–17.1)
Lymphs Abs: 1825 cells/uL (ref 850–3900)
MCH: 28 pg (ref 27.0–33.0)
MCHC: 32 g/dL (ref 32.0–36.0)
MCV: 87.3 fL (ref 80.0–100.0)
MPV: 10.8 fL (ref 7.5–12.5)
Monocytes Relative: 10.2 %
Neutro Abs: 2878 cells/uL (ref 1500–7800)
Neutrophils Relative %: 53.3 %
Platelets: 237 10*3/uL (ref 140–400)
RBC: 4.65 10*6/uL (ref 4.20–5.80)
RDW: 11.5 % (ref 11.0–15.0)
Total Lymphocyte: 33.8 %
WBC: 5.4 10*3/uL (ref 3.8–10.8)

## 2019-01-07 LAB — LIPID PANEL
Cholesterol: 192 mg/dL (ref <200)
HDL: 49 mg/dL (ref >=40)
LDL Cholesterol (Calc): 118 mg/dL (calc) — ABNORMAL HIGH
Non-HDL Cholesterol (Calc): 143 mg/dL (calc) — ABNORMAL HIGH (ref <130)
Total CHOL/HDL Ratio: 3.9 (calc) (ref <5.0)
Triglycerides: 132 mg/dL (ref <150)

## 2019-01-07 LAB — HEMOGLOBIN A1C
Hgb A1c MFr Bld: 4.8 % of total Hgb (ref <5.7)
Mean Plasma Glucose: 91 (calc)
eAG (mmol/L): 5 (calc)

## 2019-01-13 ENCOUNTER — Encounter: Payer: Self-pay | Admitting: Nurse Practitioner

## 2019-01-13 ENCOUNTER — Other Ambulatory Visit: Payer: Self-pay

## 2019-01-13 ENCOUNTER — Ambulatory Visit (INDEPENDENT_AMBULATORY_CARE_PROVIDER_SITE_OTHER): Payer: BC Managed Care – PPO | Admitting: Nurse Practitioner

## 2019-01-13 VITALS — BP 126/70 | HR 69 | Ht 70.0 in | Wt 189.6 lb

## 2019-01-13 DIAGNOSIS — I10 Essential (primary) hypertension: Secondary | ICD-10-CM

## 2019-01-13 DIAGNOSIS — Z0000 Encounter for general adult medical examination without abnormal findings: Secondary | ICD-10-CM

## 2019-01-13 MED ORDER — LISINOPRIL-HYDROCHLOROTHIAZIDE 10-12.5 MG PO TABS: 1.0000 | ORAL_TABLET | Freq: Every day | ORAL | 3 refills | Status: DC

## 2019-01-13 MED ORDER — LISINOPRIL-HYDROCHLOROTHIAZIDE 10-12.5 MG PO TABS: 1.0000 | ORAL_TABLET | Freq: Every day | ORAL | 1 refills | Status: DC

## 2019-01-13 NOTE — Patient Instructions (Addendum)
Marcus Proctor,   Thank you for coming in to clinic today.  1. Low glycemic diet is best for lower cholesterol. - Increasing Omega 3 fatty acids is good for building up HDL, Good cholesterol.  2. Stay active with good exercise.  3. Make sure you are eating high iron foods.  Iron-rich foods Heme sources (absorbed better than non-heme sources): .3 ounces of meat (beef or chicken liver, clams, mussels, oysters, beef, canned sardines (in oil)  Non-heme sources (You will absorb less of these than heme sources, but important part of iron-rich diet): .Chicken, [place], ham, veal, halibut, haddock, perch, salmon, or tuna .Breakfast cereals enriched with iron .One cup of cooked beans (kidney, lima, chickpeas) .One-half cup of tofu .One cup of dried apricots .One medium baked potato .One cup of cooked enriched egg noodles .One-fourth cup of wheat germ .1 ounce of pumpkin, sesame, or squash seeds .1 ounce of peanuts, pecans, walnuts, pistachios, roasted almonds, roasted cashews, or sunflower seeds .One-half cup of dried seedless raisins, peaches, or prunes .One medium stalk of broccoli .One cup of raw spinach .One cup of pasta (cooked, it becomes 3-4 cups) .One slice of bread, half of a small pumpernickel bagel, or bran muffin .One cup of brown or enriched rice  Please schedule a follow-up appointment. Return in about 1 year (around 01/13/2020) for annual physical with Hypertension.  If you have any other questions or concerns, please feel free to call the clinic or send a message through [place]. You may also schedule an earlier appointment if necessary.  You will receive a survey after today's visit either digitally by e-mail or paper by [employer]. Your experiences and feedback matter to [place].  Please respond so we know how we are doing as we provide care for you.  Cassell Smiles, DNP, AGNP-BC Adult Gerontology Nurse Practitioner [place]

## 2019-01-13 NOTE — Progress Notes (Signed)
Subjective:    Patient ID: Marcus Proctor, male    DOB: 02/27/1976, 43 y.o.   MRN: [id number]  Marcus Proctor is a 43 y.o. male presenting on 01/13/2019 for Annual Exam  HPI Annual Physical Exam Patient has been feeling well.  They have no acute concerns today. Sleeps 5.5 hours per 24 hrs interrupted.  HEALTH MAINTENANCE: Weight/BMI: Improving Physical activity: Regular workouts daily 1-2 hours, very active lifestyle Diet: Regular, Good fruits/veggies Seatbelt: always Sunscreen: or LS shirts if outside for extended periods Prostate exam/PSA: not due today Colon Cancer Screen: no family history.  Discuss again next year HIV/HEP C: neg Optometry: yearly Dentistry: every 6 months  VACCINES: Tetanus: up to date Influenza: declines   Hypertension - Current medications: lisinopril-hctz 10-12.5 mg once daily, tolerating well without side effects - He is not currently symptomatic. - Pt denies headache, lightheadedness, dizziness, changes in vision, chest tightness/pressure, palpitations, leg swelling, sudden loss of speech or loss of consciousness.  Past Medical History:  Diagnosis Date  . Hypertension    No past surgical history on file. Social History   Socioeconomic History  . Marital status: Married    Spouse name: Not on file  . Number of children: Not on file  . Years of education: Not on file  . Highest education level: Not on file  Occupational History  . Not on file  Social Needs  . Financial resource strain: Not on file  . Food insecurity    Worry: Not on file    Inability: Not on file  . Transportation needs    Medical: Not on file    Non-medical: Not on file  Tobacco Use  . Smoking status: Never Smoker  . Smokeless tobacco: Never Used  Substance and Sexual Activity  . Alcohol use: Yes    Comment: ocassional  . Drug use: No  . Sexual activity: Not on file  Lifestyle  . Physical activity    Days per week: Not on file    Minutes per session: Not on file   . Stress: Not on file  Relationships  . Social Musician on phone: Not on file    Gets together: Not on file    Attends religious service: Not on file    Active member of club or organization: Not on file    Attends meetings of clubs or organizations: Not on file    Relationship status: Not on file  . Intimate partner violence    Fear of current or ex partner: Not on file    Emotionally abused: Not on file    Physically abused: Not on file    Forced sexual activity: Not on file  Other Topics Concern  . Not on file  Social History Narrative  . Not on file   Family History  Problem Relation Age of Onset  . Hypertension Mother   . Cancer Father        lung cancer  . Hypertension Sister   . Hypertension Brother    Current Outpatient Medications on File Prior to Visit  Medication Sig  . ELDERBERRY PO Take by mouth.  Marland Kitchen lisinopril-hydrochlorothiazide (ZESTORETIC) 10-12.5 MG tablet Take 1 tablet by mouth daily.   No current facility-administered medications on file prior to visit.     Review of Systems  Constitutional: Negative for activity change, appetite change, fatigue and unexpected weight change.  HENT: Negative for congestion, hearing loss and trouble swallowing.   Eyes: Negative for visual disturbance.  Respiratory: Negative for choking, shortness of breath and wheezing.   Cardiovascular: Negative for chest pain and palpitations.  Gastrointestinal: Negative for abdominal pain, blood in stool, constipation and diarrhea.  Genitourinary: Negative for difficulty urinating, discharge, flank pain, genital sores, penile pain, penile swelling, scrotal swelling and testicular pain.  Musculoskeletal: Negative for arthralgias, back pain and myalgias.  Skin: Negative for color change, rash and wound.  Allergic/Immunologic: Negative for environmental allergies.  Neurological: Negative for dizziness, seizures, weakness and headaches.  Psychiatric/Behavioral: Negative for  behavioral problems, decreased concentration, dysphoric mood, sleep disturbance and suicidal ideas. The patient is not nervous/anxious.    Per HPI unless specifically indicated above     Objective:    BP 126/70 (BP Location: Left Arm, Patient Position: Sitting, Cuff Size: Normal)   Pulse 69   Ht 5\' 10"  (1.778 m)   Wt 189 lb 9.6 oz (86 kg)   BMI 27.20 kg/m   Wt Readings from Last 3 Encounters:  01/13/19 189 lb 9.6 oz (86 kg)  09/12/18 186 lb 12.8 oz (84.7 kg)  07/30/17 198 lb (89.8 kg)    Physical Exam Vitals signs and nursing note reviewed.  Constitutional:      General: He is not in acute distress.    Appearance: Normal appearance. He is well-developed and normal weight.  HENT:     Head: Normocephalic and atraumatic.     Right Ear: Tympanic membrane, ear canal and external ear normal.     Left Ear: Tympanic membrane, ear canal and external ear normal.     Nose: Nose normal.     Mouth/Throat:     Mouth: Mucous membranes are moist.     Pharynx: Oropharynx is clear.  Eyes:     Conjunctiva/sclera: Conjunctivae normal.     Pupils: Pupils are equal, round, and reactive to light.  Neck:     Musculoskeletal: Normal range of motion and neck supple.     Thyroid: No thyromegaly.     Vascular: No JVD.     Trachea: No tracheal deviation.  Cardiovascular:     Rate and Rhythm: Normal rate and regular rhythm.     Pulses: Normal pulses.     Heart sounds: Normal heart sounds. No murmur. No friction rub. No gallop.   Pulmonary:     Effort: Pulmonary effort is normal. No respiratory distress.     Breath sounds: Normal breath sounds.  Abdominal:     General: Abdomen is flat. Bowel sounds are normal. There is no distension.     Palpations: Abdomen is soft. There is no mass.     Tenderness: There is no abdominal tenderness. There is no guarding or rebound.     Hernia: No hernia is present.  Musculoskeletal: Normal range of motion.  Lymphadenopathy:     Cervical: No cervical adenopathy.   Skin:    General: Skin is warm and dry.     Capillary Refill: Capillary refill takes less than 2 seconds.  Neurological:     General: No focal deficit present.     Mental Status: He is alert and oriented to person, place, and time. Mental status is at baseline.     Cranial Nerves: No cranial nerve deficit.     Sensory: No sensory deficit.     Motor: No weakness.     Coordination: Coordination normal.     Gait: Gait normal.     Deep Tendon Reflexes: Reflexes normal.  Psychiatric:        Mood and Affect: Mood  normal.        Behavior: Behavior normal.        Thought Content: Thought content normal.        Judgment: Judgment normal.    Results for orders placed or performed in visit on 01/06/19  Comprehensive Metabolic Panel (CMET)  Result Value Ref Range   Glucose, Bld 101 65 - 139 mg/dL   BUN 20 7 - 25 mg/dL   Creat 9.47 0.96 - 2.83 mg/dL   BUN/Creatinine Ratio NOT APPLICABLE 6 - 22 (calc)   Sodium 141 135 - 146 mmol/L   Potassium 3.9 3.5 - 5.3 mmol/L   Chloride 104 98 - 110 mmol/L   CO2 28 20 - 32 mmol/L   Calcium 9.4 8.6 - 10.3 mg/dL   Total Protein 7.2 6.1 - 8.1 g/dL   Albumin 4.3 3.6 - 5.1 g/dL   Globulin 2.9 1.9 - 3.7 g/dL (calc)   AG Ratio 1.5 1.0 - 2.5 (calc)   Total Bilirubin 0.5 0.2 - 1.2 mg/dL   Alkaline phosphatase (APISO) 53 36 - 130 U/L   AST 21 10 - 40 U/L   ALT 17 9 - 46 U/L  CBC with Differential  Result Value Ref Range   WBC 5.4 3.8 - 10.8 Thousand/uL   RBC 4.65 4.20 - 5.80 Million/uL   Hemoglobin 13.0 (L) 13.2 - 17.1 g/dL   HCT 66.2 94.7 - 65.4 %   MCV 87.3 80.0 - 100.0 fL   MCH 28.0 27.0 - 33.0 pg   MCHC 32.0 32.0 - 36.0 g/dL   RDW 65.0 35.4 - 65.6 %   Platelets 237 140 - 400 Thousand/uL   MPV 10.8 7.5 - 12.5 fL   Neutro Abs 2,878 1,500 - 7,800 cells/uL   Lymphs Abs 1,825 850 - 3,900 cells/uL   Absolute Monocytes 551 200 - 950 cells/uL   Eosinophils Absolute 108 15 - 500 cells/uL   Basophils Absolute 38 0 - 200 cells/uL   Neutrophils Relative  % 53.3 %   Total Lymphocyte 33.8 %   Monocytes Relative 10.2 %   Eosinophils Relative 2.0 %   Basophils Relative 0.7 %  Hemoglobin A1c  Result Value Ref Range   Hgb A1c MFr Bld 4.8 <5.7 % of total Hgb   Mean Plasma Glucose 91 (calc)   eAG (mmol/L) 5.0 (calc)  Lipid Profile  Result Value Ref Range   Cholesterol 192 <200 mg/dL   HDL 49 > OR = 40 mg/dL   Triglycerides 812 <751 mg/dL   LDL Cholesterol (Calc) 118 (H) mg/dL (calc)   Total CHOL/HDL Ratio 3.9 <5.0 (calc)   Non-HDL Cholesterol (Calc) 143 (H) <130 mg/dL (calc)      Assessment & Plan:   Problem List Items Addressed This Visit      Cardiovascular and Mediastinum   Essential hypertension Controlled hypertension.  BP goal < 130/80.  Pt is working on lifestyle modifications.  Taking medications tolerating well without side effects. No current complications.  Plan: 1. Continue taking medications without changes 2. Reviewed pre-visit labs  3. Encouraged heart healthy diet and increasing exercise to 30 minutes most days of the week. 4. Check BP 1-2 x per week at home, keep log, and bring to clinic at next appointment. 5. Follow up 12 months.     Relevant Medications   lisinopril-hydrochlorothiazide (ZESTORETIC) 10-12.5 MG tablet    Other Visit Diagnoses    Encounter for annual physical exam    -  Primary Annual physical exam with no  new findings.  Well adult with no acute concerns. - Sleep quantity currently low, and divided.  Plan: 1. Obtain health maintenance screenings as above according to age. - Increase physical activity to 30 minutes most days of the week.  - Eat healthy diet high in vegetables and fruits; low in refined carbohydrates. - Screening labs and tests as ordered - Encouraged improving sleep quality, amounts. 2. Return 1 year for annual physical.       Meds ordered this encounter  Medications  . lisinopril-hydrochlorothiazide (ZESTORETIC) 10-12.5 MG tablet    Sig: Take 1 tablet by mouth daily.     Dispense:  90 tablet    Refill:  3    Order Specific Question:   Supervising Provider    Answer:   Olin Hauser [[id number]]    Follow up plan: Return in about 1 year (around 01/13/2020) for annual physical with Hypertension.  Cassell Smiles, DNP, AGPCNP-BC Adult Gerontology Primary Care Nurse Practitioner [facility] 01/13/2019, 8:33 AM

## 2020-01-16 ENCOUNTER — Encounter: Payer: BC Managed Care – PPO | Admitting: Family Medicine

## 2020-01-21 ENCOUNTER — Encounter: Payer: Self-pay | Admitting: Family Medicine

## 2020-05-07 ENCOUNTER — Other Ambulatory Visit: Payer: Self-pay | Admitting: Nurse Practitioner

## 2020-05-07 DIAGNOSIS — I10 Essential (primary) hypertension: Secondary | ICD-10-CM

## 2021-05-12 ENCOUNTER — Encounter: Payer: Self-pay | Admitting: Internal Medicine

## 2021-05-30 ENCOUNTER — Other Ambulatory Visit: Payer: Self-pay

## 2021-05-30 ENCOUNTER — Ambulatory Visit (INDEPENDENT_AMBULATORY_CARE_PROVIDER_SITE_OTHER): Payer: 59 | Admitting: Internal Medicine

## 2021-05-30 ENCOUNTER — Encounter: Payer: Self-pay | Admitting: Internal Medicine

## 2021-05-30 VITALS — BP 126/86 | HR 82 | Temp 97.5°F | Ht 70.0 in | Wt 195.0 lb

## 2021-05-30 DIAGNOSIS — Z6828 Body mass index (BMI) 28.0-28.9, adult: Secondary | ICD-10-CM

## 2021-05-30 DIAGNOSIS — I10 Essential (primary) hypertension: Secondary | ICD-10-CM | POA: Diagnosis not present

## 2021-05-30 DIAGNOSIS — Z0001 Encounter for general adult medical examination with abnormal findings: Secondary | ICD-10-CM

## 2021-05-30 DIAGNOSIS — Z1159 Encounter for screening for other viral diseases: Secondary | ICD-10-CM

## 2021-05-30 DIAGNOSIS — Z1211 Encounter for screening for malignant neoplasm of colon: Secondary | ICD-10-CM | POA: Diagnosis not present

## 2021-05-30 DIAGNOSIS — E663 Overweight: Secondary | ICD-10-CM | POA: Insufficient documentation

## 2021-05-30 DIAGNOSIS — Z6827 Body mass index (BMI) 27.0-27.9, adult: Secondary | ICD-10-CM | POA: Insufficient documentation

## 2021-05-30 MED ORDER — LISINOPRIL-HYDROCHLOROTHIAZIDE 10-12.5 MG PO TABS: 1.0000 | ORAL_TABLET | Freq: Every day | ORAL | 3 refills | Status: DC

## 2021-05-30 NOTE — Assessment & Plan Note (Signed)
Controlled off Lisinopril HCT however he would like to get restarted on this at this time Reinforced DASH diet and exercise for weight loss We will monitor

## 2021-05-30 NOTE — Progress Notes (Signed)
Subjective:    Patient ID: Marcus Proctor, male    DOB: 17-Feb-1976, 46 y.o.   MRN: [id number]  HPI  Patient presents the clinic today for his annual exam.  HTN: His BP today is 126/86.  He is not taking Lisinopril HCT as prescribed but would like to get restarted on this.  There is no ECG on file.  Flu: 04/2017 Tetanus: 07/2013 COVID: Pfizer x 2 Colon screening: never Vision screening: annually Dentist: biannually  Diet: He does eat meat. He consumes fruits and veggies. He occassionally eats fried foods. He drinks mostly water. Exercise: 4 times week, weights and cardio  Review of Systems     Past Medical History:  Diagnosis Date   Hypertension     Current Outpatient Medications  Medication Sig Dispense Refill   ELDERBERRY PO Take by mouth.     lisinopril-hydrochlorothiazide (ZESTORETIC) 10-12.5 MG tablet Take 1 tablet by mouth daily. 90 tablet 3   No current facility-administered medications for this visit.    No Known Allergies  Family History  Problem Relation Age of Onset   Hypertension Mother    Cancer Father        lung cancer   Hypertension Sister    Hypertension Brother     Social History   Socioeconomic History   Marital status: Married    Spouse name: Not on file   Number of children: Not on file   Years of education: Not on file   Highest education level: Not on file  Occupational History   Not on file  Tobacco Use   Smoking status: Never   Smokeless tobacco: Never  Vaping Use   Vaping Use: Never used  Substance and Sexual Activity   Alcohol use: Yes    Comment: ocassional   Drug use: No   Sexual activity: Not on file  Other Topics Concern   Not on file  Social History Narrative   Not on file   Social Determinants of Health   Financial Resource Strain: Not on file  Food Insecurity: Not on file  Transportation Needs: Not on file  Physical Activity: Not on file  Stress: Not on file  Social Connections: Not on file  Intimate  Partner Violence: Not on file     Constitutional: Denies fever, malaise, fatigue, headache or abrupt weight changes.  HEENT: Denies eye pain, eye redness, ear pain, ringing in the ears, wax buildup, runny nose, nasal congestion, bloody nose, or sore throat. Respiratory: Denies difficulty breathing, shortness of breath, cough or sputum production.   Cardiovascular: Denies chest pain, chest tightness, palpitations or swelling in the hands or feet.  Gastrointestinal: Denies abdominal pain, bloating, constipation, diarrhea or blood in the stool.  GU: Denies urgency, frequency, pain with urination, burning sensation, blood in urine, odor or discharge. Musculoskeletal: Denies decrease in range of motion, difficulty with gait, muscle pain or joint pain and swelling.  Skin: Denies redness, rashes, lesions or ulcercations.  Neurological: Denies dizziness, difficulty with memory, difficulty with speech or problems with balance and coordination.  Psych: Denies anxiety, depression, SI/HI.  No other specific complaints in a complete review of systems (except as listed in HPI above).  Objective:   Physical Exam   BP 126/86 (BP Location: Left Arm, Patient Position: Sitting, Cuff Size: Large)    Pulse 82    Temp (!) 97.5 F (36.4 C) (Temporal)    Ht 5' 10"  (1.778 m)    Wt 195 lb (88.5 kg)    SpO2 99%  BMI 27.98 kg/m   Wt Readings from Last 3 Encounters:  01/13/19 189 lb 9.6 oz (86 kg)  09/12/18 186 lb 12.8 oz (84.7 kg)  07/30/17 198 lb (89.8 kg)    General: Appears his stated age, overweight, in NAD. Skin: Warm, dry and intact. No rashes, lesions or ulcerations noted. HEENT: Head: normal shape and size; Eyes: sclera white and EOMs intact;  Neck:  Neck supple, trachea midline. No masses, lumps or thyromegaly present.  Cardiovascular: Normal rate and rhythm. S1,S2 noted.  No murmur, rubs or gallops noted. No JVD or BLE edema. Pulmonary/Chest: Normal effort and positive vesicular breath sounds. No  respiratory distress. No wheezes, rales or ronchi noted.  Abdomen: Normal bowel sounds Musculoskeletal: Strength 5/5 BUE/BLE.  No difficulty with gait.  Neurological: Alert and oriented. Cranial nerves II-XII grossly intact. Coordination normal.  Psychiatric: Mood and affect normal. Behavior is normal. Judgment and thought content normal.    BMET    Component Value Date/Time   NA 141 01/06/2019 0815   NA 142 03/09/2015 1134   K 3.9 01/06/2019 0815   CL 104 01/06/2019 0815   CO2 28 01/06/2019 0815   GLUCOSE 101 01/06/2019 0815   BUN 20 01/06/2019 0815   BUN 14 03/09/2015 1134   CREATININE 1.14 01/06/2019 0815   CALCIUM 9.4 01/06/2019 0815   GFRNONAA >60 05/07/2017 2306   GFRAA >60 05/07/2017 2306    Lipid Panel     Component Value Date/Time   CHOL 192 01/06/2019 0815   TRIG 132 01/06/2019 0815   HDL 49 01/06/2019 0815   CHOLHDL 3.9 01/06/2019 0815   LDLCALC 118 (H) 01/06/2019 0815    CBC    Component Value Date/Time   WBC 5.4 01/06/2019 0815   RBC 4.65 01/06/2019 0815   HGB 13.0 (L) 01/06/2019 0815   HCT 40.6 01/06/2019 0815   PLT 237 01/06/2019 0815   MCV 87.3 01/06/2019 0815   MCH 28.0 01/06/2019 0815   MCHC 32.0 01/06/2019 0815   RDW 11.5 01/06/2019 0815   LYMPHSABS 1,825 01/06/2019 0815   EOSABS 108 01/06/2019 0815   BASOSABS 38 01/06/2019 0815    Hgb A1C Lab Results  Component Value Date   HGBA1C 4.8 01/06/2019           Assessment & Plan:   Preventative Health Maintenance:  He declines flu shot Tetanus UTD per his report Encouraged him to get his [place] booster Referral to GI for screening colonoscopy Encouraged him to consume a balanced diet and exercise regimen Advised him to see an eye doctor and dentist annually We will check CBC, c-Met, lipid, A1c, and hep C today  RTC in 1 year, sooner if needed Webb Silversmith, NP  This visit occurred during the SARS-CoV-2 public health emergency.  Safety protocols were in place, including screening  questions prior to the visit, additional usage of staff PPE, and extensive cleaning of exam room while observing appropriate contact time as indicated for disinfecting solutions.

## 2021-05-30 NOTE — Patient Instructions (Signed)

## 2021-05-30 NOTE — Assessment & Plan Note (Signed)
Encourage diet and exercise for weight loss 

## 2021-05-31 ENCOUNTER — Other Ambulatory Visit: Payer: Self-pay

## 2021-05-31 DIAGNOSIS — Z1211 Encounter for screening for malignant neoplasm of colon: Secondary | ICD-10-CM

## 2021-05-31 LAB — COMPLETE METABOLIC PANEL WITH GFR
AG Ratio: 1.2 (calc) (ref 1.0–2.5)
ALT: 21 U/L (ref 9–46)
AST: 18 U/L (ref 10–40)
Albumin: 3.9 g/dL (ref 3.6–5.1)
Alkaline phosphatase (APISO): 52 U/L (ref 36–130)
BUN: 16 mg/dL (ref 7–25)
CO2: 30 mmol/L (ref 20–32)
Calcium: 9.3 mg/dL (ref 8.6–10.3)
Chloride: 106 mmol/L (ref 98–110)
Creat: 1.21 mg/dL (ref 0.60–1.29)
Globulin: 3.3 g/dL (calc) (ref 1.9–3.7)
Glucose, Bld: 78 mg/dL (ref 65–139)
Potassium: 3.9 mmol/L (ref 3.5–5.3)
Sodium: 142 mmol/L (ref 135–146)
Total Bilirubin: 0.4 mg/dL (ref 0.2–1.2)
Total Protein: 7.2 g/dL (ref 6.1–8.1)
eGFR: 75 mL/min/{1.73_m2} (ref >=60)

## 2021-05-31 LAB — CBC
HCT: 40 % (ref 38.5–50.0)
Hemoglobin: 13 g/dL — ABNORMAL LOW (ref 13.2–17.1)
MCH: 27.9 pg (ref 27.0–33.0)
MCHC: 32.5 g/dL (ref 32.0–36.0)
MCV: 85.8 fL (ref 80.0–100.0)
MPV: 10.9 fL (ref 7.5–12.5)
Platelets: 249 10*3/uL (ref 140–400)
RBC: 4.66 10*6/uL (ref 4.20–5.80)
RDW: 11.3 % (ref 11.0–15.0)
WBC: 6.1 10*3/uL (ref 3.8–10.8)

## 2021-05-31 LAB — LIPID PANEL
Cholesterol: 173 mg/dL (ref <200)
HDL: 40 mg/dL (ref >=40)
LDL Cholesterol (Calc): 104 mg/dL (calc) — ABNORMAL HIGH
Non-HDL Cholesterol (Calc): 133 mg/dL (calc) — ABNORMAL HIGH (ref <130)
Total CHOL/HDL Ratio: 4.3 (calc) (ref <5.0)
Triglycerides: 169 mg/dL — ABNORMAL HIGH (ref <150)

## 2021-05-31 LAB — HEMOGLOBIN A1C
Hgb A1c MFr Bld: 5.1 % of total Hgb (ref <5.7)
Mean Plasma Glucose: 100 mg/dL
eAG (mmol/L): 5.5 mmol/L

## 2021-05-31 LAB — HEPATITIS C ANTIBODY
Hepatitis C Ab: NONREACTIVE
SIGNAL TO CUT-OFF: 0.04 (ref <1.00)

## 2021-05-31 MED ORDER — NA SULFATE-K SULFATE-MG SULF 17.5-3.13-1.6 GM/177ML PO SOLN: 1.0000 | Freq: Once | ORAL | 0 refills | Status: AC

## 2021-05-31 NOTE — Progress Notes (Signed)
Gastroenterology Pre-Procedure Review  Request Date: 07/08/2021 Requesting Physician: Dr. Marius Ditch  PATIENT REVIEW QUESTIONS: The patient responded to the following health history questions as indicated:    1. Are you having any GI issues? no 2. Do you have a personal history of Polyps? no 3. Do you have a family history of Colon Cancer or Polyps? no 4. Diabetes Mellitus? no 5. Joint replacements in the past 12 months?no 6. Major health problems in the past 3 months?no 7. Any artificial heart valves, MVP, or defibrillator?no    MEDICATIONS & ALLERGIES:    Patient reports the following regarding taking any anticoagulation/antiplatelet therapy:   Plavix, Coumadin, Eliquis, Xarelto, Lovenox, Pradaxa, Brilinta, or Effient? no Aspirin? no  Patient confirms/reports the following medications:  Current Outpatient Medications  Medication Sig Dispense Refill   ELDERBERRY PO Take by mouth.     lisinopril-hydrochlorothiazide (ZESTORETIC) 10-12.5 MG tablet Take 1 tablet by mouth daily. 90 tablet 3   No current facility-administered medications for this visit.    Patient confirms/reports the following allergies:  No Known Allergies  No orders of the defined types were placed in this encounter.   AUTHORIZATION INFORMATION Primary Insurance: 1D#: Group #:  Secondary Insurance: 1D#: Group #:  SCHEDULE INFORMATION: Date: 07/08/2021 Time: Location: [place]

## 2021-06-20 ENCOUNTER — Telehealth: Payer: Self-pay

## 2021-06-20 ENCOUNTER — Telehealth: Payer: Self-pay | Admitting: Gastroenterology

## 2021-06-20 NOTE — Telephone Encounter (Signed)
Called and got patient rescheduled for 07/25/2021 sent new communications and new refferal and called endo ?

## 2021-06-20 NOTE — Telephone Encounter (Signed)
Message sent to me to reschedule patient called no answer let voicemail ?

## 2021-06-20 NOTE — Telephone Encounter (Signed)
Pt left message to  cancel and resch colonoscopy  ?

## 2021-07-25 ENCOUNTER — Encounter: Payer: Self-pay | Admitting: Gastroenterology

## 2021-07-25 ENCOUNTER — Other Ambulatory Visit: Payer: Self-pay

## 2021-07-25 ENCOUNTER — Ambulatory Visit: Payer: 59 | Admitting: Anesthesiology

## 2021-07-25 ENCOUNTER — Encounter
Admission: RE | Disposition: A | Discharge status: Home / Self Care | Payer: Self-pay | Source: Home / Self Care | Attending: Gastroenterology

## 2021-07-25 ENCOUNTER — Ambulatory Visit
Admission: RE | Admit: 2021-07-25 | Discharge: 2021-07-25 | Disposition: A | Discharge status: Home / Self Care | Payer: 59 | Attending: Gastroenterology | Admitting: Gastroenterology

## 2021-07-25 DIAGNOSIS — Z1211 Encounter for screening for malignant neoplasm of colon: Secondary | ICD-10-CM | POA: Diagnosis present

## 2021-07-25 DIAGNOSIS — I10 Essential (primary) hypertension: Secondary | ICD-10-CM | POA: Insufficient documentation

## 2021-07-25 DIAGNOSIS — Z8249 Family history of ischemic heart disease and other diseases of the circulatory system: Secondary | ICD-10-CM | POA: Insufficient documentation

## 2021-07-25 DIAGNOSIS — K5730 Diverticulosis of large intestine without perforation or abscess without bleeding: Secondary | ICD-10-CM | POA: Diagnosis not present

## 2021-07-25 HISTORY — PX: COLONOSCOPY WITH PROPOFOL: SHX5780

## 2021-07-25 SURGERY — COLONOSCOPY WITH PROPOFOL
Anesthesia: General

## 2021-07-25 MED ORDER — PROPOFOL 10 MG/ML IV BOLUS
INTRAVENOUS | Status: DC | PRN
  Administered 2021-07-25 (×2): 20 mg via INTRAVENOUS
  Administered 2021-07-25: 80 mg via INTRAVENOUS

## 2021-07-25 MED ORDER — SODIUM CHLORIDE 0.9 % IV SOLN: INTRAVENOUS | Status: DC

## 2021-07-25 MED ORDER — LIDOCAINE HCL (CARDIAC) PF 100 MG/5ML IV SOSY
PREFILLED_SYRINGE | INTRAVENOUS | Status: DC | PRN
  Administered 2021-07-25: 50 mg via INTRAVENOUS

## 2021-07-25 MED ORDER — PROPOFOL 500 MG/50ML IV EMUL
INTRAVENOUS | Status: DC | PRN
  Administered 2021-07-25: 160 ug/kg/min via INTRAVENOUS

## 2021-07-25 NOTE — H&P (Signed)
?  Cephas Darby, MD ?[address]  ?[address]  ?[place], [place] [address]  ?Main: [phone]  ?Fax: [phone] ?Pager: [phone] ? ?Primary Care Physician:  Jearld Fenton, NP ?Primary Gastroenterologist:  Dr. Cephas Darby ? ?Pre-Procedure History & Physical: ?HPI:  Marcus Proctor is a 46 y.o. male is here for an colonoscopy. ?  ?Past Medical History:  ?Diagnosis Date  ? Hypertension   ? ? ?Past Surgical History:  ?Procedure Laterality Date  ? NO PAST SURGERIES    ? ? ?Prior to Admission medications   ?Medication Sig Start Date End Date Taking? Authorizing Provider  ?ELDERBERRY PO Take by mouth.    [provider]  ?lisinopril-hydrochlorothiazide (ZESTORETIC) 10-12.5 MG tablet Take 1 tablet by mouth daily. 05/30/21   Jearld Fenton, NP  ? ? ?Allergies as of 06/01/2021  ? (No Known Allergies)  ? ? ?Family History  ?Problem Relation Age of Onset  ? Hypertension Mother   ? Lung cancer Father   ?     lung cancer  ? Hypertension Sister   ? Hypertension Brother   ? Colon cancer Neg Hx   ? Prostate cancer Neg Hx   ? ? ?Social History  ? ?Socioeconomic History  ? Marital status: Married  ?  Spouse name: Not on file  ? Number of children: Not on file  ? Years of education: Not on file  ? Highest education level: Not on file  ?Occupational History  ? Not on file  ?Tobacco Use  ? Smoking status: Never  ? Smokeless tobacco: Never  ?Vaping Use  ? Vaping Use: Never used  ?Substance and Sexual Activity  ? Alcohol use: Yes  ?  Comment: ocassional  ? Drug use: No  ? Sexual activity: Not on file  ?Other Topics Concern  ? Not on file  ?Social History Narrative  ? Not on file  ? ?Social Determinants of Health  ? ?Financial Resource Strain: Not on file  ?Food Insecurity: Not on file  ?Transportation Needs: Not on file  ?Physical Activity: Not on file  ?Stress: Not on file  ?Social Connections: Not on file  ?Intimate Partner Violence: Not on file  ? ? ?Review of Systems: ?See HPI, otherwise negative ROS ? ?Physical  Exam: ?BP 131/83   Pulse 65   Temp (!) 97.4 ?F (36.3 ?C) (Temporal)   Resp 18   Ht 5\' 8"  (1.727 m)   Wt 83.9 kg   SpO2 100%   BMI 28.13 kg/m?  ?General:   Alert,  pleasant and cooperative in NAD ?Head:  Normocephalic and atraumatic. ?Neck:  Supple; no masses or thyromegaly. ?Lungs:  Clear throughout to auscultation.    ?Heart:  Regular rate and rhythm. ?Abdomen:  Soft, nontender and nondistended. Normal bowel sounds, without guarding, and without rebound.   ?Neurologic:  Alert and  oriented x4;  grossly normal neurologically. ? ?Impression/Plan: ?[place] Carrel is here for an colonoscopy to be performed for colon cancer screening ? ?Risks, benefits, limitations, and alternatives regarding  colonoscopy have been reviewed with the patient.  Questions have been answered.  All parties agreeable. ? ? ?Sherri Sear, MD  07/25/2021, 8:33 AM ?

## 2021-07-25 NOTE — Anesthesia Procedure Notes (Signed)
Date/Time: 07/25/2021 8:37 AM ?Performed by: Lily Peer, Loralye Loberg, CRNA ?Pre-anesthesia Checklist: Patient identified, Emergency Drugs available, Suction available, Timeout performed and Patient being monitored ?Patient Re-evaluated:Patient Re-evaluated prior to induction ?Oxygen Delivery Method: Nasal cannula ?Induction Type: IV induction ? ? ? ? ?

## 2021-07-25 NOTE — Transfer of Care (Signed)
Immediate Anesthesia Transfer of Care Note ? ?Patient: Marcus Proctor ? ?Procedure(s) Performed: COLONOSCOPY WITH PROPOFOL ? ?Patient Location: Endoscopy Unit ? ?Anesthesia Type:General ? ?Level of Consciousness: drowsy ? ?Airway & Oxygen Therapy: Patient Spontanous Breathing ? ?Post-op Assessment: Report given to RN and Post -op Vital signs reviewed and stable ? ?Post vital signs: Reviewed and stable ? ?Last Vitals:  ?Vitals Value Taken Time  ?BP 109/74   ?Temp    ?Pulse 67   ?Resp 20   ?SpO2 99   ? ? ?Last Pain:  ?Vitals:  ? 07/25/21 0806  ?TempSrc: Temporal  ?PainSc: 0-No pain  ?   ? ?  ? ?Complications: No notable events documented. ?

## 2021-07-25 NOTE — Op Note (Signed)
[facility] ?Gastroenterology ?Patient Name: Marcus Proctor ?Procedure Date: 07/25/2021 8:32 AM ?MRN: [id number] ?Account #: [id number] ?Date of Birth: 10/23/1975 ?Admit Type: Outpatient ?Age: 46 ?[facility]OM [facility] ?Gender: Male ?Note Status: Finalized ?Instrument Name: Colonoscope [id number] ?Procedure:             Colonoscopy ?Indications:           Screening for colorectal malignant neoplasm, This is  ?                       the patient's first colonoscopy ?Providers:             Toney Reilohini Reddy Stokes Rattigan MD, MD ?Medicines:             General Anesthesia ?Complications:         No immediate complications. Estimated blood loss: None. ?Procedure:             Pre-Anesthesia Assessment: ?                       - Prior to the procedure, a History and Physical was  ?                       performed, and patient medications and allergies were  ?                       reviewed. The patient is competent. The risks and  ?                       benefits of the procedure and the sedation options and  ?                       risks were discussed with the patient. All questions  ?                       were answered and informed consent was obtained.  ?                       Patient identification and proposed procedure were  ?                       verified by the physician, the nurse, the  ?                       anesthesiologist, the anesthetist and the technician  ?                       in the pre-procedure area in the procedure room in the  ?                       endoscopy suite. Mental Status Examination: alert and  ?                       oriented. Airway Examination: normal oropharyngeal  ?                       airway and neck mobility. Respiratory Examination:  ?                       clear to auscultation.  CV Examination: normal.  ?                       Prophylactic Antibiotics: The patient does not require  ?                       prophylactic antibiotics. Prior Anticoagulants: The  ?                        patient has taken no previous anticoagulant or  ?                       antiplatelet agents. ASA Grade Assessment: II - A  ?                       patient with mild systemic disease. After reviewing  ?                       the risks and benefits, the patient was deemed in  ?                       satisfactory condition to undergo the procedure. The  ?                       anesthesia plan was to use general anesthesia.  ?                       Immediately prior to administration of medications,  ?                       the patient was re-assessed for adequacy to receive  ?                       sedatives. The heart rate, respiratory rate, oxygen  ?                       saturations, blood pressure, adequacy of pulmonary  ?                       ventilation, and response to care were monitored  ?                       throughout the procedure. The physical status of the  ?                       patient was re-assessed after the procedure. ?                       After obtaining informed consent, the colonoscope was  ?                       passed under direct vision. Throughout the procedure,  ?                       the patient's blood pressure, pulse, and oxygen  ?                       saturations were monitored continuously. The  ?  Colonoscope was introduced through the anus and  ?                       advanced to the the cecum, identified by appendiceal  ?                       orifice and ileocecal valve. The colonoscopy was  ?                       performed without difficulty. The patient tolerated  ?                       the procedure well. The quality of the bowel  ?                       preparation was evaluated using the BBPS [facility] Bowel  ?                       Preparation Scale) with scores of: Right Colon = 3,  ?                       Transverse Colon = 3 and Left Colon = 3 (entire mucosa  ?                       seen well with no residual staining, small fragments  ?                        of stool or opaque liquid). The total BBPS score  ?                       equals 9. ?Findings: ?     The perianal and digital rectal examinations were normal. Pertinent  ?     negatives include normal sphincter tone and no palpable rectal lesions. ?     The entire examined colon appeared normal. ?     A few diverticula were found in the entire colon. ?     The retroflexed view of the distal rectum and anal verge was normal and  ?     showed no anal or rectal abnormalities. ?Impression:            - The entire examined colon is normal. ?                       - Diverticulosis in the entire examined colon. ?                       - The distal rectum and anal verge are normal on  ?                       retroflexion view. ?                       - No specimens collected. ?Recommendation:        - Discharge patient to home (with escort). ?                       - Resume previous diet today. ?                       -  Continue present medications. ?                       - Repeat colonoscopy in 10 years for screening  ?                       purposes. ?Procedure Code(s):     --- Professional --- ?                       I0973, Colorectal cancer screening; colonoscopy on  ?                       individual not meeting criteria for high risk ?Diagnosis Code(s):     --- Professional --- ?                       Z12.11, Encounter for screening for malignant neoplasm  ?                       of colon ?                       K57.30, Diverticulosis of large intestine without  ?                       perforation or abscess without bleeding ?CPT copyright 2019 American Medical Association. All rights reserved. ?The codes documented in this report are preliminary and upon coder review may  ?be revised to meet current compliance requirements. ?Dr. Libby Maw ?Sharon Stapel Alger Memos MD, MD ?07/25/2021 8:54:30 AM ?This report has been signed electronically. ?Number of Addenda: 0 ?Note Initiated On: 07/25/2021 8:32  AM ?Scope Withdrawal Time: 0 hours 7 minutes 36 seconds  ?Total Procedure Duration: 0 hours 11 minutes 29 seconds  ?Estimated Blood Loss:  Estimated blood loss: none. ?     [facility] ?

## 2021-07-25 NOTE — Anesthesia Postprocedure Evaluation (Signed)
Anesthesia Post Note ? ?Patient: Marcus Proctor ? ?Procedure(s) Performed: COLONOSCOPY WITH PROPOFOL ? ?Patient location during evaluation: Endoscopy ?Anesthesia Type: General ?Level of consciousness: awake and alert ?Pain management: pain level controlled ?Vital Signs Assessment: post-procedure vital signs reviewed and stable ?Respiratory status: spontaneous breathing, nonlabored ventilation, respiratory function stable and patient connected to nasal cannula oxygen ?Cardiovascular status: blood pressure returned to baseline and stable ?Postop Assessment: no apparent nausea or vomiting ?Anesthetic complications: no ? ? ?No notable events documented. ? ? ?Last Vitals:  ?Vitals:  ? 07/25/21 0900 07/25/21 0910  ?BP: 124/84 (!) 124/94  ?Pulse: 64 67  ?Resp: 18 19  ?Temp:    ?SpO2: 99% 100%  ?  ?Last Pain:  ?Vitals:  ? 07/25/21 0850  ?TempSrc: Temporal  ?PainSc:   ? ? ?  ?  ?  ?  ?  ?  ? ?Cleda Mccreedy Kayleeann Huxford ? ? ? ? ?

## 2021-07-25 NOTE — Anesthesia Preprocedure Evaluation (Signed)
Anesthesia Evaluation  ?Patient identified by MRN, date of birth, ID band ?Patient awake ? ? ? ?Reviewed: ?Allergy & Precautions, NPO status , Patient's Chart, lab work & pertinent test results ? ?History of Anesthesia Complications ?Negative for: history of anesthetic complications ? ?Airway ?Mallampati: II ? ?TM Distance: >3 FB ?Neck ROM: full ? ? ? Dental ? ?(+) Chipped ?  ?Pulmonary ?neg pulmonary ROS, neg shortness of breath,  ?  ?Pulmonary exam normal ? ? ? ? ? ? ? Cardiovascular ?hypertension, (-) anginaNormal cardiovascular exam ? ? ?  ?Neuro/Psych ?negative neurological ROS ? negative psych ROS  ? GI/Hepatic ?negative GI ROS, Neg liver ROS, neg GERD  ,  ?Endo/Other  ?negative endocrine ROS ? Renal/GU ?negative Renal ROS  ?negative genitourinary ?  ?Musculoskeletal ? ? Abdominal ?  ?Peds ? Hematology ?negative hematology ROS ?(+)   ?Anesthesia Other Findings ?Past Medical History: ?No date: Hypertension ? ?Past Surgical History: ?No date: NO PAST SURGERIES ? ?BMI   ? Body Mass Index: 28.13 kg/m?  ?  ? ? Reproductive/Obstetrics ?negative OB ROS ? ?  ? ? ? ? ? ? ? ? ? ? ? ? ? ?  ?  ? ? ? ? ? ? ? ? ?Anesthesia Physical ?Anesthesia Plan ? ?ASA: 2 ? ?Anesthesia Plan: General  ? ?Post-op Pain Management:   ? ?Induction: Intravenous ? ?PONV Risk Score and Plan: Propofol infusion and TIVA ? ?Airway Management Planned: Natural Airway and Nasal Cannula ? ?Additional Equipment:  ? ?Intra-op Plan:  ? ?Post-operative Plan:  ? ?Informed Consent: I have reviewed the patients History and Physical, chart, labs and discussed the procedure including the risks, benefits and alternatives for the proposed anesthesia with the patient or authorized representative who has indicated his/her understanding and acceptance.  ? ? ? ?Dental Advisory Given ? ?Plan Discussed with: Anesthesiologist, CRNA and Surgeon ? ?Anesthesia Plan Comments: (Patient consented for risks of anesthesia including but not  limited to:  ?- adverse reactions to medications ?- risk of airway placement if required ?- damage to eyes, teeth, lips or other oral mucosa ?- nerve damage due to positioning  ?- sore throat or hoarseness ?- Damage to heart, brain, nerves, lungs, other parts of body or loss of life ? ?Patient voiced understanding.)  ? ? ? ? ? ? ?Anesthesia Quick Evaluation ? ?

## 2021-07-26 ENCOUNTER — Encounter: Payer: Self-pay | Admitting: Gastroenterology

## 2022-06-02 ENCOUNTER — Ambulatory Visit (INDEPENDENT_AMBULATORY_CARE_PROVIDER_SITE_OTHER): Payer: 59 | Admitting: Internal Medicine

## 2022-06-02 ENCOUNTER — Encounter: Payer: Self-pay | Admitting: Internal Medicine

## 2022-06-02 VITALS — BP 124/86 | HR 67 | Temp 97.1°F | Ht 71.0 in | Wt 195.0 lb

## 2022-06-02 DIAGNOSIS — R7309 Other abnormal glucose: Secondary | ICD-10-CM

## 2022-06-02 DIAGNOSIS — E663 Overweight: Secondary | ICD-10-CM | POA: Diagnosis not present

## 2022-06-02 DIAGNOSIS — E782 Mixed hyperlipidemia: Secondary | ICD-10-CM | POA: Diagnosis not present

## 2022-06-02 DIAGNOSIS — Z6827 Body mass index (BMI) 27.0-27.9, adult: Secondary | ICD-10-CM

## 2022-06-02 DIAGNOSIS — Z0001 Encounter for general adult medical examination with abnormal findings: Secondary | ICD-10-CM

## 2022-06-02 NOTE — Patient Instructions (Signed)
Health Maintenance, Male Adopting a healthy lifestyle and getting preventive care are important in promoting health and wellness. Ask your health care provider about: The right schedule for you to have regular tests and exams. Things you can do on your own to prevent diseases and keep yourself healthy. What should I know about diet, weight, and exercise? Eat a healthy diet  Eat a diet that includes plenty of vegetables, fruits, low-fat dairy products, and lean protein. Do not eat a lot of foods that are high in solid fats, added sugars, or sodium. Maintain a healthy weight Body mass index (BMI) is a measurement that can be used to identify possible weight problems. It estimates body fat based on height and weight. Your health care provider can help determine your BMI and help you achieve or maintain a healthy weight. Get regular exercise Get regular exercise. This is one of the most important things you can do for your health. Most adults should: Exercise for at least 150 minutes each week. The exercise should increase your heart rate and make you sweat (moderate-intensity exercise). Do strengthening exercises at least twice a week. This is in addition to the moderate-intensity exercise. Spend less time sitting. Even light physical activity can be beneficial. Watch cholesterol and blood lipids Have your blood tested for lipids and cholesterol at 48 years of age, then have this test every 5 years. You may need to have your cholesterol levels checked more often if: Your lipid or cholesterol levels are high. You are older than 48 years of age. You are at high risk for heart disease. What should I know about cancer screening? Many types of cancers can be detected early and may often be prevented. Depending on your health history and family history, you may need to have cancer screening at various ages. This may include screening for: Colorectal cancer. Prostate cancer. Skin cancer. Lung  cancer. What should I know about heart disease, diabetes, and high blood pressure? Blood pressure and heart disease High blood pressure causes heart disease and increases the risk of stroke. This is more likely to develop in people who have high blood pressure readings or are overweight. Talk with your health care provider about your target blood pressure readings. Have your blood pressure checked: Every 3-5 years if you are 18-39 years of age. Every year if you are 40 years old or older. If you are between the ages of 65 and 75 and are a current or former smoker, ask your health care provider if you should have a one-time screening for abdominal aortic aneurysm (AAA). Diabetes Have regular diabetes screenings. This checks your fasting blood sugar level. Have the screening done: Once every three years after age 45 if you are at a normal weight and have a low risk for diabetes. More often and at a younger age if you are overweight or have a high risk for diabetes. What should I know about preventing infection? Hepatitis B If you have a higher risk for hepatitis B, you should be screened for this virus. Talk with your health care provider to find out if you are at risk for hepatitis B infection. Hepatitis C Blood testing is recommended for: Everyone born from 1945 through 1965. Anyone with known risk factors for hepatitis C. Sexually transmitted infections (STIs) You should be screened each year for STIs, including gonorrhea and chlamydia, if: You are sexually active and are younger than 48 years of age. You are older than 48 years of age and your   health care provider tells you that you are at risk for this type of infection. Your sexual activity has changed since you were last screened, and you are at increased risk for chlamydia or gonorrhea. Ask your health care provider if you are at risk. Ask your health care provider about whether you are at high risk for HIV. Your health care provider  may recommend a prescription medicine to help prevent HIV infection. If you choose to take medicine to prevent HIV, you should first get tested for HIV. You should then be tested every 3 months for as long as you are taking the medicine. Follow these instructions at home: Alcohol use Do not drink alcohol if your health care provider tells you not to drink. If you drink alcohol: Limit how much you have to 0-2 drinks a day. Know how much alcohol is in your drink. In the [place] one drink equals one 12 oz bottle of beer (355 mL), one 5 oz glass of wine (148 mL), or one 1 oz glass of hard liquor (44 mL). Lifestyle Do not use any products that contain nicotine or tobacco. These products include cigarettes, chewing tobacco, and vaping devices, such as e-cigarettes. If you need help quitting, ask your health care provider. Do not use street drugs. Do not share needles. Ask your health care provider for help if you need support or information about quitting drugs. General instructions Schedule regular health, dental, and eye exams. Stay current with your vaccines. Tell your health care provider if: You often feel depressed. You have ever been abused or do not feel safe at home. Summary Adopting a healthy lifestyle and getting preventive care are important in promoting health and wellness. Follow your health care provider's instructions about healthy diet, exercising, and getting tested or screened for diseases. Follow your health care provider's instructions on monitoring your cholesterol and blood pressure. This information is not intended to replace advice given to you by your health care provider. Make sure you discuss any questions you have with your health care provider. Document Revised: 08/16/2020 Document Reviewed: 08/16/2020 Elsevier Patient Education  2023 Elsevier Inc.  

## 2022-06-02 NOTE — Progress Notes (Unsigned)
Subjective:    Patient ID: Marcus Proctor, male    DOB: [birth year], 47 y.o.   MRN: [id number]  HPI  Patient presents to clinic today for his annual exam.  Flu: 04/2017 Tetanus: 07/2013 COVID: x 2 Colon screening: 07/2021 Vision screening: annually Dentist: biannually  Diet: He does eat lean meat. He consumes fruits and veggies. He does eat some fried food. He drink mostly water and dt. tea Exercise: 1.5 -2 hours 4 x week  Review of Systems     Past Medical History:  Diagnosis Date   Hypertension     Current Outpatient Medications  Medication Sig Dispense Refill   ELDERBERRY PO Take by mouth.     lisinopril-hydrochlorothiazide (ZESTORETIC) 10-12.5 MG tablet Take 1 tablet by mouth daily. 90 tablet 3   No current facility-administered medications for this visit.    No Known Allergies  Family History  Problem Relation Age of Onset   Hypertension Mother    Lung cancer Father        lung cancer   Hypertension Sister    Hypertension Brother    Colon cancer Neg Hx    Prostate cancer Neg Hx     Social History   Socioeconomic History   Marital status: Married    Spouse name: Not on file   Number of children: Not on file   Years of education: Not on file   Highest education level: Not on file  Occupational History   Not on file  Tobacco Use   Smoking status: Never   Smokeless tobacco: Never  Vaping Use   Vaping Use: Never used  Substance and Sexual Activity   Alcohol use: Yes    Comment: ocassional   Drug use: No   Sexual activity: Not on file  Other Topics Concern   Not on file  Social History Narrative   Not on file   Social Determinants of Health   Financial Resource Strain: Not on file  Food Insecurity: Not on file  Transportation Needs: Not on file  Physical Activity: Not on file  Stress: Not on file  Social Connections: Not on file  Intimate Partner Violence: Not on file     Constitutional: Denies fever, malaise, fatigue, headache or abrupt  weight changes.  HEENT: Denies eye pain, eye redness, ear pain, ringing in the ears, wax buildup, runny nose, nasal congestion, bloody nose, or sore throat. Respiratory: Denies difficulty breathing, shortness of breath, cough or sputum production.   Cardiovascular: Denies chest pain, chest tightness, palpitations or swelling in the hands or feet.  Gastrointestinal: Patient reports abdominal pressure in the LUQ.  Denies bloating, constipation, diarrhea or blood in the stool.  GU: Denies urgency, frequency, pain with urination, burning sensation, blood in urine, odor or discharge. Musculoskeletal: Denies decrease in range of motion, difficulty with gait, muscle pain or joint pain and swelling.  Skin: Denies redness, rashes, lesions or ulcercations.  Neurological: Denies dizziness, difficulty with memory, difficulty with speech or problems with balance and coordination.  Psych: Denies anxiety, depression, SI/HI.  No other specific complaints in a complete review of systems (except as listed in HPI above).  Objective:   Physical Exam BP 124/86 (BP Location: Right Arm, Patient Position: Sitting, Cuff Size: Large)   Pulse 67   Temp (!) 97.1 F (36.2 C) (Temporal)   Ht '5\' 11"'$  (1.803 m)   Wt 195 lb (88.5 kg)   SpO2 100%   BMI 27.20 kg/m   Wt Readings from Last 3 Encounters:  07/25/21 185 lb (83.9 kg)  05/30/21 195 lb (88.5 kg)  01/13/19 189 lb 9.6 oz (86 kg)    General: Appears his stated age, overweight, in NAD. Skin: Warm, dry and intact. No rashes noted. HEENT: Head: normal shape and size; Eyes: sclera white, no icterus, conjunctiva pink, PERRLA and EOMs intact;  Neck:  Neck supple, trachea midline. No masses, lumps or thyromegaly present.  Cardiovascular: Normal rate and rhythm. S1,S2 noted.  No murmur, rubs or gallops noted. No JVD or BLE edema.  Pulmonary/Chest: Normal effort and positive vesicular breath sounds. No respiratory distress. No wheezes, rales or ronchi noted.  Abdomen:  Soft and nontender. Normal bowel sounds. No distention or masses noted. Liver, spleen and kidneys non palpable. Musculoskeletal: Strength 5/5 BUE/BLE.  No difficulty with gait.  Neurological: Alert and oriented. Cranial nerves II-XII grossly intact. Coordination normal.  Psychiatric: Mood and affect normal. Behavior is normal. Judgment and thought content normal.    BMET    Component Value Date/Time   NA 142 05/30/2021 1529   NA 142 03/09/2015 1134   K 3.9 05/30/2021 1529   CL 106 05/30/2021 1529   CO2 30 05/30/2021 1529   GLUCOSE 78 05/30/2021 1529   BUN 16 05/30/2021 1529   BUN 14 03/09/2015 1134   CREATININE 1.21 05/30/2021 1529   CALCIUM 9.3 05/30/2021 1529   GFRNONAA >60 05/07/2017 2306   GFRAA >60 05/07/2017 2306    Lipid Panel     Component Value Date/Time   CHOL 173 05/30/2021 1529   TRIG 169 (H) 05/30/2021 1529   HDL 40 05/30/2021 1529   CHOLHDL 4.3 05/30/2021 1529   LDLCALC 104 (H) 05/30/2021 1529    CBC    Component Value Date/Time   WBC 6.1 05/30/2021 1529   RBC 4.66 05/30/2021 1529   HGB 13.0 (L) 05/30/2021 1529   HCT 40.0 05/30/2021 1529   PLT 249 05/30/2021 1529   MCV 85.8 05/30/2021 1529   MCH 27.9 05/30/2021 1529   MCHC 32.5 05/30/2021 1529   RDW 11.3 05/30/2021 1529   LYMPHSABS 1,825 01/06/2019 0815   EOSABS 108 01/06/2019 0815   BASOSABS 38 01/06/2019 0815    Hgb A1C Lab Results  Component Value Date   HGBA1C 5.1 05/30/2021           Assessment & Plan:   Preventative Health Maintenance:  Encouraged him to get a flu shot in the fall Tetanus UTD Encouraged him to get his COVID-vaccine Colon screening UTD Encouraged him to get him a balanced diet and exercise regimen Advised him to see eye doctor and dentist annually We will check CBC, c-Met, lipid, A1c today  LUQ Pain:  Gas versus colitis versus diverticulosis Advised him to try Gas-X Offered referral to GI for further evaluation EGD but he declines at this time  RTC in 6  months, follow-up chronic conditions Webb Silversmith, NP

## 2022-06-02 NOTE — Assessment & Plan Note (Signed)
Encouraged diet and exercise for weight loss ?

## 2022-06-03 LAB — CBC
HCT: 41.6 % (ref 38.5–50.0)
Hemoglobin: 13.7 g/dL (ref 13.2–17.1)
MCH: 28 pg (ref 27.0–33.0)
MCHC: 32.9 g/dL (ref 32.0–36.0)
MCV: 85.1 fL (ref 80.0–100.0)
MPV: 10.6 fL (ref 7.5–12.5)
Platelets: 265 10*3/uL (ref 140–400)
RBC: 4.89 10*6/uL (ref 4.20–5.80)
RDW: 11.3 % (ref 11.0–15.0)
WBC: 5.5 10*3/uL (ref 3.8–10.8)

## 2022-06-03 LAB — COMPLETE METABOLIC PANEL WITH GFR
AG Ratio: 1.3 (calc) (ref 1.0–2.5)
ALT: 27 U/L (ref 9–46)
AST: 23 U/L (ref 10–40)
Albumin: 4.3 g/dL (ref 3.6–5.1)
Alkaline phosphatase (APISO): 58 U/L (ref 36–130)
BUN: 17 mg/dL (ref 7–25)
CO2: 29 mmol/L (ref 20–32)
Calcium: 9.5 mg/dL (ref 8.6–10.3)
Chloride: 103 mmol/L (ref 98–110)
Creat: 1.19 mg/dL (ref 0.60–1.29)
Globulin: 3.3 g/dL (calc) (ref 1.9–3.7)
Glucose, Bld: 100 mg/dL — ABNORMAL HIGH (ref 65–99)
Potassium: 4 mmol/L (ref 3.5–5.3)
Sodium: 142 mmol/L (ref 135–146)
Total Bilirubin: 0.5 mg/dL (ref 0.2–1.2)
Total Protein: 7.6 g/dL (ref 6.1–8.1)
eGFR: 76 mL/min/{1.73_m2} (ref >=60)

## 2022-06-03 LAB — LIPID PANEL
Cholesterol: 193 mg/dL (ref <200)
HDL: 55 mg/dL (ref >=40)
LDL Cholesterol (Calc): 115 mg/dL (calc) — ABNORMAL HIGH
Non-HDL Cholesterol (Calc): 138 mg/dL (calc) — ABNORMAL HIGH (ref <130)
Total CHOL/HDL Ratio: 3.5 (calc) (ref <5.0)
Triglycerides: 119 mg/dL (ref <150)

## 2022-06-03 LAB — HEMOGLOBIN A1C
Hgb A1c MFr Bld: 5.2 % of total Hgb (ref <5.7)
Mean Plasma Glucose: 103 mg/dL
eAG (mmol/L): 5.7 mmol/L

## 2022-06-10 ENCOUNTER — Other Ambulatory Visit: Payer: Self-pay | Admitting: Internal Medicine

## 2022-06-10 DIAGNOSIS — I10 Essential (primary) hypertension: Secondary | ICD-10-CM

## 2022-06-12 NOTE — Telephone Encounter (Signed)
Requested Prescriptions  Pending Prescriptions Disp Refills   lisinopril-hydrochlorothiazide (ZESTORETIC) 10-12.5 MG tablet [Pharmacy Med Name: LISINOPRIL-HCTZ 10-12.5 MG TAB] 90 tablet 3    Sig: TAKE 1 TABLET BY MOUTH EVERY DAY     Cardiovascular:  ACEI + Diuretic Combos Passed - 06/10/2022  8:56 AM      Passed - Na in normal range and within 180 days    Sodium  Date Value Ref Range Status  06/02/2022 142 135 - 146 mmol/L Final  03/09/2015 142 136 - 144 mmol/L Final    Comment:    **Effective March 22, 2015 the reference interval**   for Sodium, Serum will be changing to:                                             134 - 144          Passed - K in normal range and within 180 days    Potassium  Date Value Ref Range Status  06/02/2022 4.0 3.5 - 5.3 mmol/L Final         Passed - Cr in normal range and within 180 days    Creat  Date Value Ref Range Status  06/02/2022 1.19 0.60 - 1.29 mg/dL Final         Passed - eGFR is 30 or above and within 180 days    GFR calc Af Amer  Date Value Ref Range Status  05/07/2017 >60 >60 mL/min Final    Comment:    (NOTE) The eGFR has been calculated using the CKD EPI equation. This calculation has not been validated in all clinical situations. eGFR's persistently <60 mL/min signify possible Chronic Kidney Disease.    GFR calc non Af Amer  Date Value Ref Range Status  05/07/2017 >60 >60 mL/min Final   eGFR  Date Value Ref Range Status  06/02/2022 76 > OR = 60 mL/min/1.65m Final         Passed - Patient is not pregnant      Passed - Last BP in normal range    BP Readings from Last 1 Encounters:  06/02/22 124/86         Passed - Valid encounter within last 6 months    Recent Outpatient Visits           1 week ago [facility] for general [facility] medical examination with abnormal findings   [facility]   1 year ago [facility] for general adult medical examination with abnormal  findings   [facility]   3 years ago [facility] for annual physical exam   [facility]   3 years ago Essential hypertension   [place]   4 years ago Essential hypertension   [facility]       Future Appointments             In 5 months Baity, [facility]

## 2022-12-01 ENCOUNTER — Encounter: Payer: Self-pay | Admitting: Internal Medicine

## 2022-12-01 ENCOUNTER — Ambulatory Visit (INDEPENDENT_AMBULATORY_CARE_PROVIDER_SITE_OTHER): Payer: 59 | Admitting: Internal Medicine

## 2022-12-01 VITALS — BP 106/72 | HR 76 | Temp 96.8°F | Wt 189.0 lb

## 2022-12-01 DIAGNOSIS — I10 Essential (primary) hypertension: Secondary | ICD-10-CM

## 2022-12-01 DIAGNOSIS — Z6826 Body mass index (BMI) 26.0-26.9, adult: Secondary | ICD-10-CM

## 2022-12-01 DIAGNOSIS — E663 Overweight: Secondary | ICD-10-CM | POA: Diagnosis not present

## 2022-12-01 DIAGNOSIS — R140 Abdominal distension (gaseous): Secondary | ICD-10-CM | POA: Diagnosis not present

## 2022-12-01 DIAGNOSIS — E782 Mixed hyperlipidemia: Secondary | ICD-10-CM | POA: Diagnosis not present

## 2022-12-01 NOTE — Patient Instructions (Signed)

## 2022-12-01 NOTE — Assessment & Plan Note (Signed)
Encourage diet and exercise for weight loss 

## 2022-12-01 NOTE — Progress Notes (Signed)
Subjective:    Patient ID: Marcus Proctor, male    DOB: Jan 10, 1976, 47 y.o.   MRN: [id number]  HPI  Patient presents to the clinic today for 36-month follow-up of chronic conditions.  HTN: His BP today is 106/72.  He is taking lisinopril HCT as prescribed.  There is no ECG on file.  HLD: His last LDL was 115, triglycerides 119, 05/2022.  He is not taking any cholesterol-lowering medication at this time.  He does not consume low-fat diet.  Review of Systems     Past Medical History:  Diagnosis Date   Hypertension     Current Outpatient Medications  Medication Sig Dispense Refill   ELDERBERRY PO Take by mouth.     lisinopril-hydrochlorothiazide (ZESTORETIC) 10-12.5 MG tablet TAKE 1 TABLET BY MOUTH EVERY DAY 90 tablet 1   No current facility-administered medications for this visit.    No Known Allergies  Family History  Problem Relation Age of Onset   Hypertension Mother    Lung cancer Father        lung cancer   Hypertension Sister    Hypertension Brother    Colon cancer Neg Hx    Prostate cancer Neg Hx     Social History   Socioeconomic History   Marital status: Married    Spouse name: Not on file   Number of children: Not on file   Years of education: Not on file   Highest education level: Not on file  Occupational History   Not on file  Tobacco Use   Smoking status: Never   Smokeless tobacco: Never  Vaping Use   Vaping status: Never Used  Substance and Sexual Activity   Alcohol use: Yes    Comment: ocassional   Drug use: No   Sexual activity: Not on file  Other Topics Concern   Not on file  Social History Narrative   Not on file   Social Determinants of Health   Financial Resource Strain: Not on file  Food Insecurity: Not on file  Transportation Needs: Not on file  Physical Activity: Not on file  Stress: Not on file  Social Connections: Not on file  Intimate Partner Violence: Not on file     Constitutional: Denies fever, malaise, fatigue,  headache or abrupt weight changes.  HEENT: Denies eye pain, eye redness, ear pain, ringing in the ears, wax buildup, runny nose, nasal congestion, bloody nose, or sore throat. Respiratory: Denies difficulty breathing, shortness of breath, cough or sputum production.   Cardiovascular: Denies chest pain, chest tightness, palpitations or swelling in the hands or feet.  Gastrointestinal: Pt reports bloating, gassiness, LLQ pain. Denies abdominal pain, constipation, diarrhea or blood in the stool.  GU: Denies urgency, frequency, pain with urination, burning sensation, blood in urine, odor or discharge. Musculoskeletal: Denies decrease in range of motion, difficulty with gait, muscle pain or joint pain and swelling.  Skin: Denies redness, rashes, lesions or ulcercations.  Neurological: Denies dizziness, difficulty with memory, difficulty with speech or problems with balance and coordination.  Psych: Denies anxiety, depression, SI/HI.  No other specific complaints in a complete review of systems (except as listed in HPI above).  Objective:   Physical Exam   BP 106/72 (BP Location: Left Arm, Patient Position: Sitting, Cuff Size: Large)   Pulse 76   Temp (!) 96.8 F (36 C) (Temporal)   Wt 189 lb (85.7 kg)   SpO2 99%   BMI 26.36 kg/m   Wt Readings from Last 3 Encounters:  06/02/22 195 lb (88.5 kg)  07/25/21 185 lb (83.9 kg)  05/30/21 195 lb (88.5 kg)    General: Appears his stated age, well developed, well nourished in NAD. Skin: Warm, dry and intact. Cardiovascular: Normal rate and rhythm. S1,S2 noted.  No murmur, rubs or gallops noted. No JVD or BLE edema. Pulmonary/Chest: Normal effort and positive vesicular breath sounds. No respiratory distress. No wheezes, rales or ronchi noted.  Abdomen: Soft and nontender. Normal bowel sounds. No distention or masses noted. Liver, spleen and kidneys non palpable. Musculoskeletal: No difficulty with gait.  Neurological: Alert and oriented.   Coordination normal.     BMET    Component Value Date/Time   NA 142 06/02/2022 1518   NA 142 03/09/2015 1134   K 4.0 06/02/2022 1518   CL 103 06/02/2022 1518   CO2 29 06/02/2022 1518   GLUCOSE 100 (H) 06/02/2022 1518   BUN 17 06/02/2022 1518   BUN 14 03/09/2015 1134   CREATININE 1.19 06/02/2022 1518   CALCIUM 9.5 06/02/2022 1518   GFRNONAA >60 05/07/2017 2306   GFRAA >60 05/07/2017 2306    Lipid Panel     Component Value Date/Time   CHOL 193 06/02/2022 1518   TRIG 119 06/02/2022 1518   HDL 55 06/02/2022 1518   CHOLHDL 3.5 06/02/2022 1518   LDLCALC 115 (H) 06/02/2022 1518    CBC    Component Value Date/Time   WBC 5.5 06/02/2022 1518   RBC 4.89 06/02/2022 1518   HGB 13.7 06/02/2022 1518   HCT 41.6 06/02/2022 1518   PLT 265 06/02/2022 1518   MCV 85.1 06/02/2022 1518   MCH 28.0 06/02/2022 1518   MCHC 32.9 06/02/2022 1518   RDW 11.3 06/02/2022 1518   LYMPHSABS 1,825 01/06/2019 0815   EOSABS 108 01/06/2019 0815   BASOSABS 38 01/06/2019 0815    Hgb A1C Lab Results  Component Value Date   HGBA1C 5.2 06/02/2022           Assessment & Plan:   Gassiness, bloating:  Will check H. Pylori Okay to continue Gas-X as needed Colonoscopy reviewed, history of diverticulosis but has never had flare of diverticulitis Consider CT abdomen/pelvis versus referral to GI for further evaluation  RTC in 6 months for your annual exam Nicki Reaper, NP

## 2022-12-01 NOTE — Assessment & Plan Note (Signed)
C-Met and lipid profile today Encouraged him to consume a low-fat diet 

## 2022-12-01 NOTE — Assessment & Plan Note (Signed)
Controlled on lisinopril HCT Reinforced DASH diet and exercise for weight loss C-Met today 

## 2022-12-08 LAB — COMPLETE METABOLIC PANEL WITH GFR
AG Ratio: 1.3 (calc) (ref 1.0–2.5)
ALT: 25 U/L (ref 9–46)
AST: 19 U/L (ref 10–40)
Albumin: 4.2 g/dL (ref 3.6–5.1)
Alkaline phosphatase (APISO): 53 U/L (ref 36–130)
BUN: 21 mg/dL (ref 7–25)
CO2: 27 mmol/L (ref 20–32)
Calcium: 9.9 mg/dL (ref 8.6–10.3)
Chloride: 105 mmol/L (ref 98–110)
Creat: 1.12 mg/dL (ref 0.60–1.29)
Globulin: 3.3 g/dL (ref 1.9–3.7)
Glucose, Bld: 88 mg/dL (ref 65–139)
Potassium: 4.2 mmol/L (ref 3.5–5.3)
Sodium: 142 mmol/L (ref 135–146)
Total Bilirubin: 0.5 mg/dL (ref 0.2–1.2)
Total Protein: 7.5 g/dL (ref 6.1–8.1)
eGFR: 81 mL/min/{1.73_m2} (ref >=60)

## 2022-12-08 LAB — LIPID PANEL
Cholesterol: 173 mg/dL (ref <200)
HDL: 54 mg/dL (ref >=40)
LDL Cholesterol (Calc): 101 mg/dL — ABNORMAL HIGH
Non-HDL Cholesterol (Calc): 119 mg/dL (ref <130)
Total CHOL/HDL Ratio: 3.2 (calc) (ref <5.0)
Triglycerides: 89 mg/dL (ref <150)

## 2022-12-08 LAB — H. PYLORI BREATH TEST: H. pylori Breath Test: DETECTED — AB

## 2022-12-08 MED ORDER — PANTOPRAZOLE SODIUM 40 MG PO TBEC: 40.0000 mg | DELAYED_RELEASE_TABLET | Freq: Two times a day (BID) | ORAL | 0 refills | Status: DC

## 2022-12-08 MED ORDER — CLARITHROMYCIN 500 MG PO TABS: 500.0000 mg | ORAL_TABLET | Freq: Two times a day (BID) | ORAL | 0 refills | Status: DC

## 2022-12-08 MED ORDER — AMOXICILLIN 500 MG PO TABS: 1000.0000 mg | ORAL_TABLET | Freq: Two times a day (BID) | ORAL | 0 refills | Status: DC

## 2022-12-08 NOTE — Addendum Note (Signed)
Addended by: Lorre Munroe on: 12/08/2022 10:06 AM   Modules accepted: Orders

## 2022-12-11 ENCOUNTER — Other Ambulatory Visit: Payer: Self-pay | Admitting: Internal Medicine

## 2022-12-11 DIAGNOSIS — I10 Essential (primary) hypertension: Secondary | ICD-10-CM

## 2022-12-13 NOTE — Telephone Encounter (Signed)
Requested Prescriptions  Pending Prescriptions Disp Refills   lisinopril-hydrochlorothiazide (ZESTORETIC) 10-12.5 MG tablet [Pharmacy Med Name: LISINOPRIL-HCTZ 10-12.5 MG TAB] 90 tablet 1    Sig: TAKE 1 TABLET BY MOUTH EVERY DAY     Cardiovascular:  ACEI + Diuretic Combos Passed - 12/11/2022 12:44 AM      Passed - Na in normal range and within 180 days    Sodium  Date Value Ref Range Status  12/01/2022 142 135 - 146 mmol/L Final  03/09/2015 142 136 - 144 mmol/L Final    Comment:    **Effective March 22, 2015 the reference interval**   for Sodium, Serum will be changing to:                                             134 - 144          Passed - K in normal range and within 180 days    Potassium  Date Value Ref Range Status  12/01/2022 4.2 3.5 - 5.3 mmol/L Final         Passed - Cr in normal range and within 180 days    Creat  Date Value Ref Range Status  12/01/2022 1.12 0.60 - 1.29 mg/dL Final         Passed - eGFR is 30 or above and within 180 days    GFR calc Af Amer  Date Value Ref Range Status  05/07/2017 >60 >60 mL/min Final    Comment:    (NOTE) The eGFR has been calculated using the CKD EPI equation. This calculation has not been validated in all clinical situations. eGFR's persistently <60 mL/min signify possible Chronic Kidney Disease.    GFR calc non Af Amer  Date Value Ref Range Status  05/07/2017 >60 >60 mL/min Final   eGFR  Date Value Ref Range Status  12/01/2022 81 > OR = 60 mL/min/1.37m2 Final         Passed - Patient is not pregnant      Passed - Last BP in normal range    BP Readings from Last 1 Encounters:  12/01/22 106/72         Passed - Valid encounter within last 6 months    Recent Outpatient Visits           1 week ago Mixed hyperlipidemia   [place] [facility] [place], Salvadore Oxford, NP   6 months ago Encounter for general adult medical examination with abnormal findings   [place] [facility]  [place], Salvadore Oxford, NP   1 year ago Encounter for general adult medical examination with abnormal findings   [place] [facility] [place], Salvadore Oxford, NP   3 years ago Encounter for annual physical exam   [place] [facility] [place], Alison Stalling, NP   4 years ago Essential hypertension   [place] [facility] [place] Smitty Cords, DO       Future Appointments             In 5 months Baity, Salvadore Oxford, NP [place] [facility] [place], [facility]

## 2023-06-08 ENCOUNTER — Encounter: Payer: Self-pay | Admitting: Internal Medicine

## 2023-06-08 ENCOUNTER — Ambulatory Visit (INDEPENDENT_AMBULATORY_CARE_PROVIDER_SITE_OTHER): Payer: 59 | Admitting: Internal Medicine

## 2023-06-08 VITALS — BP 118/78 | Ht 71.0 in | Wt 200.6 lb

## 2023-06-08 DIAGNOSIS — E663 Overweight: Secondary | ICD-10-CM

## 2023-06-08 DIAGNOSIS — R1012 Left upper quadrant pain: Secondary | ICD-10-CM | POA: Diagnosis not present

## 2023-06-08 DIAGNOSIS — R739 Hyperglycemia, unspecified: Secondary | ICD-10-CM

## 2023-06-08 DIAGNOSIS — Z6827 Body mass index (BMI) 27.0-27.9, adult: Secondary | ICD-10-CM

## 2023-06-08 DIAGNOSIS — Z0001 Encounter for general adult medical examination with abnormal findings: Secondary | ICD-10-CM | POA: Diagnosis not present

## 2023-06-08 DIAGNOSIS — E782 Mixed hyperlipidemia: Secondary | ICD-10-CM | POA: Diagnosis not present

## 2023-06-08 MED ORDER — LISINOPRIL-HYDROCHLOROTHIAZIDE 10-12.5 MG PO TABS: 1.0000 | ORAL_TABLET | Freq: Every day | ORAL | 1 refills | Status: DC

## 2023-06-08 NOTE — Progress Notes (Signed)
 Subjective:    Patient ID: Marcus Proctor, male    DOB: [birth year], 48 y.o.   MRN: [id number]  HPI  Patient presents to clinic today for his annual exam.  Flu: 04/2017 Tetanus: 07/2013 COVID: x 2 Colon screening: 07/2021 Vision screening: annually Dentist: biannually  Diet: He does eat lean meat. He consumes fruits and veggies. He does eat some fried food. He drink mostly water and dt. tea Exercise: 1.5 -2 hours 4 x week  Review of Systems     Past Medical History:  Diagnosis Date   Hypertension     Current Outpatient Medications  Medication Sig Dispense Refill   amoxicillin (AMOXIL) 500 MG tablet Take 2 tablets (1,000 mg total) by mouth 2 (two) times daily. 56 tablet 0   clarithromycin (BIAXIN) 500 MG tablet Take 1 tablet (500 mg total) by mouth 2 (two) times daily. 28 tablet 0   ELDERBERRY PO Take by mouth.     lisinopril-hydrochlorothiazide (ZESTORETIC) 10-12.5 MG tablet TAKE 1 TABLET BY MOUTH EVERY DAY 90 tablet 1   pantoprazole (PROTONIX) 40 MG tablet Take 1 tablet (40 mg total) by mouth 2 (two) times daily. 28 tablet 0   No current facility-administered medications for this visit.    No Known Allergies  Family History  Problem Relation Age of Onset   Hypertension Mother    Lung cancer Father        lung cancer   Hypertension Sister    Hypertension Brother    Colon cancer Neg Hx    Prostate cancer Neg Hx     Social History   Socioeconomic History   Marital status: Married    Spouse name: Not on file   Number of children: Not on file   Years of education: Not on file   Highest education level: Not on file  Occupational History   Not on file  Tobacco Use   Smoking status: Never   Smokeless tobacco: Never  Vaping Use   Vaping status: Never Used  Substance and Sexual Activity   Alcohol use: Yes    Comment: ocassional   Drug use: No   Sexual activity: Not on file  Other Topics Concern   Not on file  Social History Narrative   Not on file    Social Drivers of Health   Financial Resource Strain: Not on file  Food Insecurity: Not on file  Transportation Needs: Not on file  Physical Activity: Not on file  Stress: Not on file  Social Connections: Not on file  Intimate Partner Violence: Not on file     Constitutional: Denies fever, malaise, fatigue, headache or abrupt weight changes.  HEENT: Denies eye pain, eye redness, ear pain, ringing in the ears, wax buildup, runny nose, nasal congestion, bloody nose, or sore throat. Respiratory: Denies difficulty breathing, shortness of breath, cough or sputum production.   Cardiovascular: Denies chest pain, chest tightness, palpitations or swelling in the hands or feet.  Gastrointestinal: Patient reports abdominal pressure in the LUQ.  Denies bloating, constipation, diarrhea or blood in the stool.  GU: Denies urgency, frequency, pain with urination, burning sensation, blood in urine, odor or discharge. Musculoskeletal: Pt reports intermittent right knee pain. Denies decrease in range of motion, difficulty with gait, muscle pain or joint swelling.  Skin: Denies redness, rashes, lesions or ulcercations.  Neurological: Denies dizziness, difficulty with memory, difficulty with speech or problems with balance and coordination.  Psych: Denies anxiety, depression, SI/HI.  No other specific complaints in a complete  review of systems (except as listed in HPI above).  Objective:   Physical Exam BP 118/78 (BP Location: Left Arm, Patient Position: Sitting, Cuff Size: Normal)   Ht 5\' 11"  (1.803 m)   Wt 200 lb 9.6 oz (91 kg)   BMI 27.98 kg/m    Wt Readings from Last 3 Encounters:  12/01/22 189 lb (85.7 kg)  06/02/22 195 lb (88.5 kg)  07/25/21 185 lb (83.9 kg)    General: Appears his stated age, overweight, in NAD. Skin: Warm, dry and intact. No rashes noted. HEENT: Head: normal shape and size; Eyes: sclera white, no icterus, conjunctiva pink, PERRLA and EOMs intact;  Neck:  Neck  supple, trachea midline. No masses, lumps or thyromegaly present.  Cardiovascular: Normal rate and rhythm. S1,S2 noted.  No murmur, rubs or gallops noted. No JVD or BLE edema.  Pulmonary/Chest: Normal effort and positive vesicular breath sounds. No respiratory distress. No wheezes, rales or ronchi noted.  Abdomen: Soft and nontender. Normal bowel sounds. No distention or masses noted. Liver, spleen and kidneys non palpable. Musculoskeletal: Strength 5/5 BUE/BLE.  No difficulty with gait.  Neurological: Alert and oriented. Cranial nerves II-XII grossly intact. Coordination normal.  Psychiatric: Mood and affect normal. Behavior is normal. Judgment and thought content normal.    BMET    Component Value Date/Time   NA 142 12/01/2022 1407   NA 142 03/09/2015 1134   K 4.2 12/01/2022 1407   CL 105 12/01/2022 1407   CO2 27 12/01/2022 1407   GLUCOSE 88 12/01/2022 1407   BUN 21 12/01/2022 1407   BUN 14 03/09/2015 1134   CREATININE 1.12 12/01/2022 1407   CALCIUM 9.9 12/01/2022 1407   GFRNONAA >60 05/07/2017 2306   GFRAA >60 05/07/2017 2306    Lipid Panel     Component Value Date/Time   CHOL 173 12/01/2022 1407   TRIG 89 12/01/2022 1407   HDL 54 12/01/2022 1407   CHOLHDL 3.2 12/01/2022 1407   LDLCALC 101 (H) 12/01/2022 1407    CBC    Component Value Date/Time   WBC 5.5 06/02/2022 1518   RBC 4.89 06/02/2022 1518   HGB 13.7 06/02/2022 1518   HCT 41.6 06/02/2022 1518   PLT 265 06/02/2022 1518   MCV 85.1 06/02/2022 1518   MCH 28.0 06/02/2022 1518   MCHC 32.9 06/02/2022 1518   RDW 11.3 06/02/2022 1518   LYMPHSABS 1,825 01/06/2019 0815   EOSABS 108 01/06/2019 0815   BASOSABS 38 01/06/2019 0815    Hgb A1C Lab Results  Component Value Date   HGBA1C 5.2 06/02/2022           Assessment & Plan:   Preventative Health Maintenance:  He declines flu shot today Tetanus UTD Encouraged him to get his COVID-vaccine Colon screening UTD Encouraged him to consume a balanced diet  and exercise regimen Advised him to see eye doctor and dentist annually We will check CBC, c-Met, lipid, A1c today  LUQ abdominal pain, history of H. pylori:  Referral to GI for upper endoscopy as symptoms persist despite treatment  RTC in 6 months, follow-up chronic conditions Nicki Reaper, NP

## 2023-06-08 NOTE — Patient Instructions (Signed)
 Health Maintenance, Male  Adopting a healthy lifestyle and getting preventive care are important in promoting health and wellness. Ask your health care provider about:  The right schedule for you to have regular tests and exams.  Things you can do on your own to prevent diseases and keep yourself healthy.  What should I know about diet, weight, and exercise?  Eat a healthy diet    Eat a diet that includes plenty of vegetables, fruits, low-fat dairy products, and lean protein.  Do not eat a lot of foods that are high in solid fats, added sugars, or sodium.  Maintain a healthy weight  Body mass index (BMI) is a measurement that can be used to identify possible weight problems. It estimates body fat based on height and weight. Your health care provider can help determine your BMI and help you achieve or maintain a healthy weight.  Get regular exercise  Get regular exercise. This is one of the most important things you can do for your health. Most adults should:  Exercise for at least 150 minutes each week. The exercise should increase your heart rate and make you sweat (moderate-intensity exercise).  Do strengthening exercises at least twice a week. This is in addition to the moderate-intensity exercise.  Spend less time sitting. Even light physical activity can be beneficial.  Watch cholesterol and blood lipids  Have your blood tested for lipids and cholesterol at 49 years of age, then have this test every 5 years.  You may need to have your cholesterol levels checked more often if:  Your lipid or cholesterol levels are high.  You are older than 49 years of age.  You are at high risk for heart disease.  What should I know about cancer screening?  Many types of cancers can be detected early and may often be prevented. Depending on your health history and family history, you may need to have cancer screening at various ages. This may include screening for:  Colorectal cancer.  Prostate cancer.  Skin cancer.  Lung  cancer.  What should I know about heart disease, diabetes, and high blood pressure?  Blood pressure and heart disease  High blood pressure causes heart disease and increases the risk of stroke. This is more likely to develop in people who have high blood pressure readings or are overweight.  Talk with your health care provider about your target blood pressure readings.  Have your blood pressure checked:  Every 3-5 years if you are 9-95 years of age.  Every year if you are 85 years old or older.  If you are between the ages of 29 and 29 and are a current or former smoker, ask your health care provider if you should have a one-time screening for abdominal aortic aneurysm (AAA).  Diabetes  Have regular diabetes screenings. This checks your fasting blood sugar level. Have the screening done:  Once every three years after age 23 if you are at a normal weight and have a low risk for diabetes.  More often and at a younger age if you are overweight or have a high risk for diabetes.  What should I know about preventing infection?  Hepatitis B  If you have a higher risk for hepatitis B, you should be screened for this virus. Talk with your health care provider to find out if you are at risk for hepatitis B infection.  Hepatitis C  Blood testing is recommended for:  Everyone born from [birth year] through 1966.  Anyone  with known risk factors for hepatitis C.  Sexually transmitted infections (STIs)  You should be screened each year for STIs, including gonorrhea and chlamydia, if:  You are sexually active and are younger than 49 years of age.  You are older than 49 years of age and your health care provider tells you that you are at risk for this type of infection.  Your sexual activity has changed since you were last screened, and you are at increased risk for chlamydia or gonorrhea. Ask your health care provider if you are at risk.  Ask your health care provider about whether you are at high risk for HIV. Your health care provider  may recommend a prescription medicine to help prevent HIV infection. If you choose to take medicine to prevent HIV, you should first get tested for HIV. You should then be tested every 3 months for as long as you are taking the medicine.  Follow these instructions at home:  Alcohol use  Do not drink alcohol if your health care provider tells you not to drink.  If you drink alcohol:  Limit how much you have to 0-2 drinks a day.  Know how much alcohol is in your drink. In the [place] one drink equals one 12 oz bottle of beer (355 mL), one 5 oz glass of wine (148 mL), or one 1 oz glass of hard liquor (44 mL).  Lifestyle  Do not use any products that contain nicotine or tobacco. These products include cigarettes, chewing tobacco, and vaping devices, such as e-cigarettes. If you need help quitting, ask your health care provider.  Do not use street drugs.  Do not share needles.  Ask your health care provider for help if you need support or information about quitting drugs.  General instructions  Schedule regular health, dental, and eye exams.  Stay current with your vaccines.  Tell your health care provider if:  You often feel depressed.  You have ever been abused or do not feel safe at home.  Summary  Adopting a healthy lifestyle and getting preventive care are important in promoting health and wellness.  Follow your health care provider's instructions about healthy diet, exercising, and getting tested or screened for diseases.  Follow your health care provider's instructions on monitoring your cholesterol and blood pressure.  This information is not intended to replace advice given to you by your health care provider. Make sure you discuss any questions you have with your health care provider.  Document Revised: 08/16/2020 Document Reviewed: 08/16/2020  Elsevier Patient Education  2024 [employer].

## 2023-06-08 NOTE — Assessment & Plan Note (Signed)
 Encourage diet and exercise for weight loss

## 2023-06-09 LAB — CBC
HCT: 44.1 % (ref 38.5–50.0)
Hemoglobin: 14.1 g/dL (ref 13.2–17.1)
MCH: 28.3 pg (ref 27.0–33.0)
MCHC: 32 g/dL (ref 32.0–36.0)
MCV: 88.6 fL (ref 80.0–100.0)
MPV: 10.9 fL (ref 7.5–12.5)
Platelets: 228 10*3/uL (ref 140–400)
RBC: 4.98 10*6/uL (ref 4.20–5.80)
RDW: 11.2 % (ref 11.0–15.0)
WBC: 5.4 10*3/uL (ref 3.8–10.8)

## 2023-06-09 LAB — LIPID PANEL
Cholesterol: 201 mg/dL — ABNORMAL HIGH (ref <200)
HDL: 51 mg/dL (ref >=40)
LDL Cholesterol (Calc): 125 mg/dL — ABNORMAL HIGH
Non-HDL Cholesterol (Calc): 150 mg/dL — ABNORMAL HIGH (ref <130)
Total CHOL/HDL Ratio: 3.9 (calc) (ref <5.0)
Triglycerides: 135 mg/dL (ref <150)

## 2023-06-09 LAB — HEMOGLOBIN A1C
Hgb A1c MFr Bld: 5.2 %{Hb} (ref <5.7)
Mean Plasma Glucose: 103 mg/dL
eAG (mmol/L): 5.7 mmol/L

## 2023-06-09 LAB — COMPLETE METABOLIC PANEL WITH GFR
AG Ratio: 1.2 (calc) (ref 1.0–2.5)
ALT: 23 U/L (ref 9–46)
AST: 19 U/L (ref 10–40)
Albumin: 4.2 g/dL (ref 3.6–5.1)
Alkaline phosphatase (APISO): 58 U/L (ref 36–130)
BUN: 16 mg/dL (ref 7–25)
CO2: 31 mmol/L (ref 20–32)
Calcium: 9.6 mg/dL (ref 8.6–10.3)
Chloride: 103 mmol/L (ref 98–110)
Creat: 1.17 mg/dL (ref 0.60–1.29)
Globulin: 3.4 g/dL (ref 1.9–3.7)
Glucose, Bld: 90 mg/dL (ref 65–139)
Potassium: 4.1 mmol/L (ref 3.5–5.3)
Sodium: 143 mmol/L (ref 135–146)
Total Bilirubin: 0.6 mg/dL (ref 0.2–1.2)
Total Protein: 7.6 g/dL (ref 6.1–8.1)
eGFR: 77 mL/min/{1.73_m2} (ref >=60)

## 2023-06-10 ENCOUNTER — Encounter: Payer: Self-pay | Admitting: Internal Medicine

## 2023-09-14 ENCOUNTER — Encounter: Payer: Self-pay | Admitting: Internal Medicine

## 2023-09-14 ENCOUNTER — Ambulatory Visit (INDEPENDENT_AMBULATORY_CARE_PROVIDER_SITE_OTHER): Admitting: Internal Medicine

## 2023-09-14 VITALS — BP 110/72 | Ht 71.0 in | Wt 193.0 lb

## 2023-09-14 DIAGNOSIS — R1012 Left upper quadrant pain: Secondary | ICD-10-CM

## 2023-09-14 DIAGNOSIS — M549 Dorsalgia, unspecified: Secondary | ICD-10-CM

## 2023-09-14 DIAGNOSIS — Z8619 Personal history of other infectious and parasitic diseases: Secondary | ICD-10-CM | POA: Diagnosis not present

## 2023-09-14 NOTE — Patient Instructions (Signed)
 Abdominal Pain, Adult    Many things can cause belly (abdominal) pain. In most cases, belly pain is not a serious problem and can be watched and treated at home. But in some cases, it can be serious.  Your doctor will try to find the cause of your belly pain.  Follow these instructions at home:  Medicines  Take over-the-counter and prescription medicines only as told by your doctor.  Do not take medicines that help you poop (laxatives) unless told by your doctor.  General instructions  Watch your belly pain for any changes. Tell your doctor if the pain gets worse.  Drink enough fluid to keep your pee (urine) pale yellow.  Contact a doctor if:  Your belly pain changes or gets worse.  You have very bad cramping or bloating in your belly.  You vomit.  Your pain gets worse with meals, after eating, or with certain foods.  You have trouble pooping or have watery poop for more than 2-3 days.  You are not hungry, or you lose weight without trying.  You have signs of not getting enough fluid or water (dehydration). These may include:  Dark pee, very Chastain pee, or no pee.  Cracked lips or dry mouth.  Feeling sleepy or weak.  You have pain when you pee or poop.  Your belly pain wakes you up at night.  You have blood in your pee.  You have a fever.  Get help right away if:  You cannot stop vomiting.  Your pain is only in one part of your belly, like on the right side.  You have bloody or black poop, or poop that looks like tar.  You have trouble breathing.  You have chest pain.  These symptoms may be an emergency. Get help right away. Call 911.  Do not wait to see if the symptoms will go away.  Do not drive yourself to the hospital.  This information is not intended to replace advice given to you by your health care provider. Make sure you discuss any questions you have with your health care provider.  Document Revised: 01/11/2022 Document Reviewed: 01/11/2022  Elsevier Patient Education  2024 [employer].

## 2023-09-14 NOTE — Progress Notes (Signed)
 Subjective:    Patient ID: Marcus Proctor, male    DOB: May 01, 1975, 48 y.o.   MRN: [id number]  HPI  Discussed the use of AI scribe software for clinical note transcription with the patient, who gave verbal consent to proceed.  Marcus Proctor is a 48 year old male who presents with persistent left upper quadrant and left mid back pain.  He has experienced persistent pain in the left upper quadrant and back pain for several years. The pain is constant and does not feel muscular. Previous treatments, including gas pills and peppermint, have not provided relief.  He frequently experiences burping and belching, describing it as a 'burst of air', but denies acid reflux. No nausea, heartburn, or urinary symptoms. Bowel movements are normal. He notes a sensation similar to an 'air pocket' in his stomach, sometimes audible as a 'frog sound'.  He was treated for H pylori 11/2022 with clarithromycin , amoxicillin  and pantoprazole . Despite this, his symptoms persist.  He denies routine alcohol use. Pain does not worsen with eating. Ct abd/pelvis from 2019 showed:  IMPRESSION: 1. Marked thickening of the walls of the proximal descending colon, involving a 10 cm segment of the colon, with associated pericolonic inflammation/fluid stranding, consistent with acute colitis of infectious, inflammatory or ischemic etiology. Favor infectious colitis. There is marked luminal narrowing due to the colonic wall thickening but no associated bowel obstruction at this time. Recommend follow-up CT in 1-2 months to ensure resolution, to exclude the less likely possibility of neoplastic thickening. 2. 11 mm hyperdense focus within the thickened wall of the proximal descending colon, differential considerations including acute hemorrhage and retained oral contrast from previous fluoroscopic or CT exam. Favor retained oral contrast given the stability in appearance on both early contrast and delayed contrast images. 3. No  abscess collection or free intraperitoneal air seen. 4. Bilateral renal cysts.  Review of Systems     Past Medical History:  Diagnosis Date   Hypertension     Current Outpatient Medications  Medication Sig Dispense Refill   ELDERBERRY PO Take by mouth.     lisinopril -hydrochlorothiazide  (ZESTORETIC ) 10-12.5 MG tablet Take 1 tablet by mouth daily. 90 tablet 1   No current facility-administered medications for this visit.    No Known Allergies  Family History  Problem Relation Age of Onset   Hypertension Mother    Lung cancer Father        lung cancer   Hypertension Sister    Hypertension Brother    Colon cancer Neg Hx    Prostate cancer Neg Hx     Social History   Socioeconomic History   Marital status: Married    Spouse name: Not on file   Number of children: Not on file   Years of education: Not on file   Highest education level: Not on file  Occupational History   Not on file  Tobacco Use   Smoking status: Never   Smokeless tobacco: Never  Vaping Use   Vaping status: Never Used  Substance and Sexual Activity   Alcohol use: Yes    Comment: ocassional   Drug use: No   Sexual activity: Not on file  Other Topics Concern   Not on file  Social History Narrative   Not on file   Social Drivers of Health   Financial Resource Strain: Not on file  Food Insecurity: Not on file  Transportation Needs: Not on file  Physical Activity: Not on file  Stress: Not on file  Social  Connections: Not on file  Intimate Partner Violence: Not on file     Constitutional: Denies fever, malaise, fatigue, headache or abrupt weight changes.  Respiratory: Denies difficulty breathing, shortness of breath, cough or sputum production.   Cardiovascular: Denies chest pain, chest tightness, palpitations or swelling in the hands or feet.  Gastrointestinal: Patient reports abdominal pressure in the LUQ.  Denies bloating, constipation, diarrhea or blood in the stool.  GU: Denies  urgency, frequency, pain with urination, burning sensation, blood in urine, odor or discharge. Musculoskeletal: Pt reports intermittent left side mid back pain. Denies decrease in range of motion, difficulty with gait, muscle pain or joint swelling.  Skin: Denies redness, rashes, lesions or ulcercations.  Neurological: Denies dizziness, difficulty with memory, difficulty with speech or problems with balance and coordination.   No other specific complaints in a complete review of systems (except as listed in HPI above).  Objective:   Physical Exam BP 110/72 (BP Location: Right Arm, Patient Position: Sitting, Cuff Size: Normal)   Ht 5\' 11"  (1.803 m)   Wt 193 lb (87.5 kg)   BMI 26.92 kg/m     Wt Readings from Last 3 Encounters:  06/08/23 200 lb 9.6 oz (91 kg)  12/01/22 189 lb (85.7 kg)  06/02/22 195 lb (88.5 kg)    General: Appears his stated age, overweight, in NAD. Skin: Warm, dry and intact. No rashes noted. Cardiovascular: Normal rate and rhythm. S1,S2 noted.  No murmur, rubs or gallops noted. No JVD or BLE edema.  Pulmonary/Chest: Normal effort and positive vesicular breath sounds. No respiratory distress. No wheezes, rales or ronchi noted.  Abdomen: Soft and nontender. Normal bowel sounds.When he increases his intra abdominal pressure, you can audibly hear a gurgling sound in the LUQ.  No distention or masses noted. Liver, spleen and kidneys non palpable. Musculoskeletal: No pain with palpation of the left side of the ribs. No difficulty with gait.  Neurological: Alert and oriented.   BMET    Component Value Date/Time   NA 143 06/08/2023 0839   NA 142 03/09/2015 1134   K 4.1 06/08/2023 0839   CL 103 06/08/2023 0839   CO2 31 06/08/2023 0839   GLUCOSE 90 06/08/2023 0839   BUN 16 06/08/2023 0839   BUN 14 03/09/2015 1134   CREATININE 1.17 06/08/2023 0839   CALCIUM 9.6 06/08/2023 0839   GFRNONAA >60 05/07/2017 2306   GFRAA >60 05/07/2017 2306    Lipid Panel      Component Value Date/Time   CHOL 201 (H) 06/08/2023 0839   TRIG 135 06/08/2023 0839   HDL 51 06/08/2023 0839   CHOLHDL 3.9 06/08/2023 0839   LDLCALC 125 (H) 06/08/2023 0839    CBC    Component Value Date/Time   WBC 5.4 06/08/2023 0839   RBC 4.98 06/08/2023 0839   HGB 14.1 06/08/2023 0839   HCT 44.1 06/08/2023 0839   PLT 228 06/08/2023 0839   MCV 88.6 06/08/2023 0839   MCH 28.3 06/08/2023 0839   MCHC 32.0 06/08/2023 0839   RDW 11.2 06/08/2023 0839   LYMPHSABS 1,825 01/06/2019 0815   EOSABS 108 01/06/2019 0815   BASOSABS 38 01/06/2019 0815    Hgb A1C Lab Results  Component Value Date   HGBA1C 5.2 06/08/2023           Assessment & Plan:   LUQ abdominal pain, Left mid back pain, history of H. pylori:  Prior referral to GI for further evaluation of symptoms was sent to [facility] and  never scheduled because all the providers left. Referral to [facility] for upper endoscopy as symptoms persist despite treatment  RTC in 2 months, follow-up chronic conditions Helayne Lo, NP

## 2023-12-12 ENCOUNTER — Other Ambulatory Visit: Payer: Self-pay | Admitting: Internal Medicine

## 2023-12-12 DIAGNOSIS — Z0001 Encounter for general adult medical examination with abnormal findings: Secondary | ICD-10-CM

## 2023-12-12 NOTE — Telephone Encounter (Signed)
 Requested medication (s) are due for refill today:   Yes  Requested medication (s) are on the active medication list:   Yes  Future visit scheduled:   Yes 9/12 with Angeline   Last ordered: 06/08/2023 #90, 1 refill  Unable to refill because labs are due.   Requested Prescriptions  Pending Prescriptions Disp Refills   lisinopril -hydrochlorothiazide  (ZESTORETIC ) 10-12.5 MG tablet [Pharmacy Med Name: LISINOPRIL -HCTZ 10-12.5 MG TAB] 90 tablet 1    Sig: TAKE 1 TABLET BY MOUTH EVERY DAY     Cardiovascular:  ACEI + Diuretic Combos Failed - 12/12/2023  2:32 PM      Failed - Na in normal range and within 180 days    Sodium  Date Value Ref Range Status  06/08/2023 143 135 - 146 mmol/L Final  03/09/2015 142 136 - 144 mmol/L Final    Comment:    **Effective March 22, 2015 the reference interval**   for Sodium, Serum will be changing to:                                             134 - 144          Failed - K in normal range and within 180 days    Potassium  Date Value Ref Range Status  06/08/2023 4.1 3.5 - 5.3 mmol/L Final         Failed - Cr in normal range and within 180 days    Creat  Date Value Ref Range Status  06/08/2023 1.17 0.60 - 1.29 mg/dL Final         Failed - eGFR is 30 or above and within 180 days    GFR calc Af Amer  Date Value Ref Range Status  05/07/2017 >60 >60 mL/min Final    Comment:    (NOTE) The eGFR has been calculated using the CKD EPI equation. This calculation has not been validated in all clinical situations. eGFR's persistently <60 mL/min signify possible Chronic Kidney Disease.    GFR calc non Af Amer  Date Value Ref Range Status  05/07/2017 >60 >60 mL/min Final   eGFR  Date Value Ref Range Status  06/08/2023 77 > OR = 60 mL/min/1.57m2 Final         Failed - Valid encounter within last 6 months    Recent Outpatient Visits           2 months ago LUQ abdominal pain   [facility] [place], Angeline ORN, NP   6  months ago Encounter for general adult medical examination with abnormal findings   [facility] [place], Angeline ORN, [place]              Passed - Patient is not pregnant      Passed - Last BP in normal range    BP Readings from Last 1 Encounters:  09/14/23 110/72

## 2023-12-21 ENCOUNTER — Encounter: Payer: Self-pay | Admitting: Internal Medicine

## 2023-12-21 ENCOUNTER — Ambulatory Visit: Payer: 59 | Admitting: Internal Medicine

## 2023-12-21 VITALS — BP 128/80 | Ht 71.0 in | Wt 195.4 lb

## 2023-12-21 DIAGNOSIS — E782 Mixed hyperlipidemia: Secondary | ICD-10-CM | POA: Diagnosis not present

## 2023-12-21 DIAGNOSIS — I10 Essential (primary) hypertension: Secondary | ICD-10-CM

## 2023-12-21 DIAGNOSIS — R1012 Left upper quadrant pain: Secondary | ICD-10-CM

## 2023-12-21 DIAGNOSIS — R140 Abdominal distension (gaseous): Secondary | ICD-10-CM

## 2023-12-21 DIAGNOSIS — E663 Overweight: Secondary | ICD-10-CM

## 2023-12-21 DIAGNOSIS — S46812A Strain of other muscles, fascia and tendons at shoulder and upper arm level, left arm, initial encounter: Secondary | ICD-10-CM

## 2023-12-21 DIAGNOSIS — Z6827 Body mass index (BMI) 27.0-27.9, adult: Secondary | ICD-10-CM

## 2023-12-21 LAB — COMPREHENSIVE METABOLIC PANEL WITH GFR
AG Ratio: 1.4 (calc) (ref 1.0–2.5)
ALT: 20 U/L (ref 9–46)
AST: 18 U/L (ref 10–40)
Albumin: 4.5 g/dL (ref 3.6–5.1)
Alkaline phosphatase (APISO): 58 U/L (ref 36–130)
BUN: 23 mg/dL (ref 7–25)
CO2: 31 mmol/L (ref 20–32)
Calcium: 9.8 mg/dL (ref 8.6–10.3)
Chloride: 101 mmol/L (ref 98–110)
Creat: 1.12 mg/dL (ref 0.60–1.29)
Globulin: 3.2 g/dL (ref 1.9–3.7)
Glucose, Bld: 115 mg/dL — ABNORMAL HIGH (ref 65–99)
Potassium: 4 mmol/L (ref 3.5–5.3)
Sodium: 140 mmol/L (ref 135–146)
Total Bilirubin: 0.5 mg/dL (ref 0.2–1.2)
Total Protein: 7.7 g/dL (ref 6.1–8.1)
eGFR: 81 mL/min/1.73m2 (ref >=60)

## 2023-12-21 LAB — LIPID PANEL
Cholesterol: 193 mg/dL (ref <200)
HDL: 48 mg/dL (ref >=40)
LDL Cholesterol (Calc): 119 mg/dL — ABNORMAL HIGH
Non-HDL Cholesterol (Calc): 145 mg/dL — ABNORMAL HIGH (ref <130)
Total CHOL/HDL Ratio: 4 (calc) (ref <5.0)
Triglycerides: 144 mg/dL (ref <150)

## 2023-12-21 NOTE — Assessment & Plan Note (Signed)
 Controlled on lisinopril  HCT 10-12.5 mg daily Reinforced DASH diet and exercise for weight loss C-Met today

## 2023-12-21 NOTE — Assessment & Plan Note (Signed)
 Encourage diet and exercise for weight loss

## 2023-12-21 NOTE — Patient Instructions (Signed)

## 2023-12-21 NOTE — Assessment & Plan Note (Signed)
 C-Met and lipid profile today Discussed LDL goal < 100 Encouraged him to consume a low-fat diet

## 2023-12-21 NOTE — Progress Notes (Signed)
 Subjective:    Patient ID: Marcus Proctor, male    DOB: [birth year], 48 y.o.   MRN: [id number]  HPI  Patient presents to the clinic today for 77-month follow-up of chronic conditions.  HTN: His BP today is 128/80.  He is taking lisinopril  HCT as prescribed.  There is no ECG on file.  HLD: His last LDL was 125, triglycerides 135, 05/2023.  He is not taking any cholesterol-lowering medication at this time.  He does not consume low-fat diet.  He also reports left shoulder pain. He noticed this a few months ago. He describes the pain as a sharp, pinprick. The pain does not radiate. He denies numbness, tingling or weakness of his left upper extremity. He denies any injury to the area. He has not tried anything OTC for this.   He also reports persistent gassiness, bloating and LUQ abdominal pain. He saw GI for the same 10/2023. He was started on omeprazole in addition to gas x but he reports none of this has helped. The plan is for an upper endoscopy by the end of the year if symptoms do not improve.  Review of Systems     Past Medical History:  Diagnosis Date   Hypertension     Current Outpatient Medications  Medication Sig Dispense Refill   ELDERBERRY PO Take by mouth.     lisinopril -hydrochlorothiazide  (ZESTORETIC ) 10-12.5 MG tablet TAKE 1 TABLET BY MOUTH EVERY DAY 90 tablet 1   No current facility-administered medications for this visit.    No Known Allergies  Family History  Problem Relation Age of Onset   Hypertension Mother    Lung cancer Father        lung cancer   Hypertension Sister    Hypertension Brother    Colon cancer Neg Hx    Prostate cancer Neg Hx     Social History   Socioeconomic History   Marital status: Married    Spouse name: Not on file   Number of children: Not on file   Years of education: Not on file   Highest education level: Not on file  Occupational History   Not on file  Tobacco Use   Smoking status: Never   Smokeless tobacco: Never   Vaping Use   Vaping status: Never Used  Substance and Sexual Activity   Alcohol use: Yes    Comment: ocassional   Drug use: No   Sexual activity: Not on file  Other Topics Concern   Not on file  Social History Narrative   Not on file   Social Drivers of Health   Financial Resource Strain: Low Risk  (10/18/2023)   Received from [facility]   Overall Financial Resource Strain (CARDIA)    Difficulty of Paying Living Expenses: Not hard at all  Food Insecurity: No Food Insecurity (10/18/2023)   Received from [facility]   Hunger Vital Sign    Within the past 12 months, you worried that your food would run out before you got the money to buy more.: Never true    Within the past 12 months, the food you bought just didn't last and you didn't have money to get more.: Never true  Transportation Needs: No Transportation Needs (10/18/2023)   Received from [facility]    In the past 12 months, has lack of transportation kept you from medical appointments or from getting medications?: No    Lack of Transportation (Non-Medical):  No  Physical Activity: Not on file  Stress: Not on file  Social Connections: Not on file  Intimate Partner Violence: Not on file     Constitutional: Denies fever, malaise, fatigue, headache or abrupt weight changes.  HEENT: Denies eye pain, eye redness, ear pain, ringing in the ears, wax buildup, runny nose, nasal congestion, bloody nose, or sore throat. Respiratory: Denies difficulty breathing, shortness of breath, cough or sputum production.   Cardiovascular: Denies chest pain, chest tightness, palpitations or swelling in the hands or feet.  Gastrointestinal: Pt reports bloating, gassiness, LUQ pain. Denies abdominal pain, constipation, diarrhea or blood in the stool.  GU: Denies urgency, frequency, pain with urination, burning sensation, blood in urine, odor or  discharge. Musculoskeletal: Pt reports left shoulder pain. Denies decrease in range of motion, difficulty with gait, muscle pain or joint swelling.  Skin: Denies redness, rashes, lesions or ulcercations.  Neurological: Denies dizziness, difficulty with memory, difficulty with speech or problems with balance and coordination.  Psych: Denies anxiety, depression, SI/HI.  No other specific complaints in a complete review of systems (except as listed in HPI above).  Objective:   Physical Exam  BP 128/80 (BP Location: Left Arm, Patient Position: Sitting, Cuff Size: Normal)   Ht 5' 11 (1.803 m)   Wt 195 lb 6.4 oz (88.6 kg)   BMI 27.25 kg/m    Wt Readings from Last 3 Encounters:  09/14/23 193 lb (87.5 kg)  06/08/23 200 lb 9.6 oz (91 kg)  12/01/22 189 lb (85.7 kg)    General: Appears his stated age, overweight, in NAD. Skin: Warm, dry and intact. Cardiovascular: Normal rate and rhythm. S1,S2 noted.  No murmur, rubs or gallops noted. No JVD or BLE edema. Pulmonary/Chest: Normal effort and positive vesicular breath sounds. No respiratory distress. No wheezes, rales or ronchi noted.  Abdomen: Soft and nontender. Normal bowel sounds. No distention or masses noted. Liver, spleen and kidneys non palpable. Musculoskeletal: Normal internal and external rotation of the left shoulder. Negative drop can test of the shoulder. Pain with palpation of the left trapezius. Shoulder shrug equal. No difficulty with gait.  Neurological: Alert and oriented.  Coordination normal.     BMET    Component Value Date/Time   NA 143 06/08/2023 0839   NA 142 03/09/2015 1134   K 4.1 06/08/2023 0839   CL 103 06/08/2023 0839   CO2 31 06/08/2023 0839   GLUCOSE 90 06/08/2023 0839   BUN 16 06/08/2023 0839   BUN 14 03/09/2015 1134   CREATININE 1.17 06/08/2023 0839   CALCIUM 9.6 06/08/2023 0839   GFRNONAA >60 05/07/2017 2306   GFRAA >60 05/07/2017 2306    Lipid Panel     Component Value Date/Time   CHOL 201  (H) 06/08/2023 0839   TRIG 135 06/08/2023 0839   HDL 51 06/08/2023 0839   CHOLHDL 3.9 06/08/2023 0839   LDLCALC 125 (H) 06/08/2023 0839    CBC    Component Value Date/Time   WBC 5.4 06/08/2023 0839   RBC 4.98 06/08/2023 0839   HGB 14.1 06/08/2023 0839   HCT 44.1 06/08/2023 0839   PLT 228 06/08/2023 0839   MCV 88.6 06/08/2023 0839   MCH 28.3 06/08/2023 0839   MCHC 32.0 06/08/2023 0839   RDW 11.2 06/08/2023 0839   LYMPHSABS 1,825 01/06/2019 0815   EOSABS 108 01/06/2019 0815   BASOSABS 38 01/06/2019 0815    Hgb A1C Lab Results  Component Value Date   HGBA1C 5.2 06/08/2023  Assessment & Plan:   Left trapezius pain:  Likely related to tension vs posture Encouraged regular stretching Encouraged heat and massage  Abdominal bloating, gassiness, persistent LUQ abdominal pain:  He will continue omeprazole 40 mg and gas x until his followup with GI Plan for EGD in the near future  RTC in 6 months for your annual exam Angeline Laura, NP

## 2023-12-24 ENCOUNTER — Ambulatory Visit: Payer: Self-pay | Admitting: Internal Medicine

## 2024-06-12 ENCOUNTER — Encounter: Admitting: Internal Medicine
